# Patient Record
Sex: Male | Born: 1995 | Race: White | Hispanic: No | Marital: Single | Smoking: Never smoker
Health system: Southern US, Community
[De-identification: ages and names within clinical notes are randomized; demographics above are authoritative.]

## PROBLEM LIST (undated history)

## (undated) DIAGNOSIS — T7840XA Allergy, unspecified, initial encounter: Secondary | ICD-10-CM

## (undated) DIAGNOSIS — J45909 Unspecified asthma, uncomplicated: Secondary | ICD-10-CM

## (undated) HISTORY — PX: NO PAST SURGERIES: SHX2092

## (undated) HISTORY — DX: Unspecified asthma, uncomplicated: J45.909

## (undated) HISTORY — DX: Allergy, unspecified, initial encounter: T78.40XA

---
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
## 2016-01-19 DIAGNOSIS — J029 Acute pharyngitis, unspecified: Secondary | ICD-10-CM | POA: Diagnosis not present

## 2016-01-28 DIAGNOSIS — Z23 Encounter for immunization: Secondary | ICD-10-CM | POA: Diagnosis not present

## 2016-01-28 DIAGNOSIS — J019 Acute sinusitis, unspecified: Secondary | ICD-10-CM | POA: Diagnosis not present

## 2016-02-15 DIAGNOSIS — R0981 Nasal congestion: Secondary | ICD-10-CM | POA: Diagnosis not present

## 2016-02-15 DIAGNOSIS — J039 Acute tonsillitis, unspecified: Secondary | ICD-10-CM | POA: Diagnosis not present

## 2016-02-15 DIAGNOSIS — J342 Deviated nasal septum: Secondary | ICD-10-CM | POA: Diagnosis not present

## 2016-02-15 DIAGNOSIS — J351 Hypertrophy of tonsils: Secondary | ICD-10-CM | POA: Diagnosis not present

## 2016-05-03 DIAGNOSIS — J019 Acute sinusitis, unspecified: Secondary | ICD-10-CM | POA: Diagnosis not present

## 2016-05-03 DIAGNOSIS — J4520 Mild intermittent asthma, uncomplicated: Secondary | ICD-10-CM | POA: Diagnosis not present

## 2016-05-03 DIAGNOSIS — J302 Other seasonal allergic rhinitis: Secondary | ICD-10-CM | POA: Diagnosis not present

## 2016-05-25 DIAGNOSIS — J342 Deviated nasal septum: Secondary | ICD-10-CM | POA: Diagnosis not present

## 2016-06-01 ENCOUNTER — Other Ambulatory Visit: Payer: Self-pay | Admitting: Otolaryngology

## 2016-06-01 DIAGNOSIS — R1310 Dysphagia, unspecified: Secondary | ICD-10-CM

## 2016-06-05 ENCOUNTER — Ambulatory Visit: Payer: Self-pay

## 2016-06-05 ENCOUNTER — Ambulatory Visit
Admission: RE | Admit: 2016-06-05 | Discharge: 2016-06-05 | Disposition: A | Discharge status: Home / Self Care | Payer: 59 | Source: Ambulatory Visit | Attending: Otolaryngology | Admitting: Otolaryngology

## 2016-06-05 DIAGNOSIS — R1310 Dysphagia, unspecified: Secondary | ICD-10-CM | POA: Insufficient documentation

## 2016-06-23 ENCOUNTER — Ambulatory Visit
Admission: RE | Admit: 2016-06-23 | Discharge: 2016-06-23 | Disposition: A | Discharge status: Home / Self Care | Payer: 59 | Source: Ambulatory Visit | Attending: Pediatrics | Admitting: Pediatrics

## 2016-06-23 ENCOUNTER — Other Ambulatory Visit: Payer: Self-pay | Admitting: Pediatrics

## 2016-06-23 DIAGNOSIS — R079 Chest pain, unspecified: Secondary | ICD-10-CM | POA: Diagnosis not present

## 2016-07-14 DIAGNOSIS — J342 Deviated nasal septum: Secondary | ICD-10-CM | POA: Diagnosis not present

## 2016-07-14 DIAGNOSIS — R0981 Nasal congestion: Secondary | ICD-10-CM | POA: Diagnosis not present

## 2016-07-14 DIAGNOSIS — J343 Hypertrophy of nasal turbinates: Secondary | ICD-10-CM | POA: Diagnosis not present

## 2016-10-30 ENCOUNTER — Other Ambulatory Visit: Payer: Self-pay

## 2016-10-31 DIAGNOSIS — J302 Other seasonal allergic rhinitis: Secondary | ICD-10-CM | POA: Insufficient documentation

## 2016-10-31 DIAGNOSIS — J45909 Unspecified asthma, uncomplicated: Secondary | ICD-10-CM | POA: Insufficient documentation

## 2016-11-01 ENCOUNTER — Ambulatory Visit (INDEPENDENT_AMBULATORY_CARE_PROVIDER_SITE_OTHER): Payer: 59 | Admitting: Gastroenterology

## 2016-11-01 ENCOUNTER — Encounter: Payer: Self-pay | Admitting: Gastroenterology

## 2016-11-01 ENCOUNTER — Other Ambulatory Visit: Payer: Self-pay

## 2016-11-01 ENCOUNTER — Ambulatory Visit: Payer: Self-pay | Admitting: Gastroenterology

## 2016-11-01 VITALS — BP 135/89 | HR 102 | Temp 98.4°F | Ht 72.0 in | Wt 180.0 lb

## 2016-11-01 DIAGNOSIS — R1319 Other dysphagia: Secondary | ICD-10-CM

## 2016-11-01 DIAGNOSIS — R1310 Dysphagia, unspecified: Secondary | ICD-10-CM | POA: Diagnosis not present

## 2016-11-01 MED ORDER — DEXLANSOPRAZOLE 60 MG PO CPDR: 60.0000 mg | DELAYED_RELEASE_CAPSULE | Freq: Every day | ORAL | 0 refills | Status: DC

## 2016-11-01 NOTE — Progress Notes (Signed)
Gastroenterology Consultation  Referring Provider:     Bronson IngPage, Kristen, MD Primary Care Physician:  Bronson IngKristen Page, MD Primary Gastroenterologist:  Dr. Wyline MoodKiran Leiani Enright  Reason for Consultation:     Difficulty swallowing         HPI:   Perry Franklin is a 20 y.o. y/o male referred for consultation & management  by Dr. Bronson IngKristen Page, MD.     Dysphagia: Onset and any progression: couple of years Frequency: progressing Foods affected : solids, does not affect liquids Prior episodes of impaction: yes, eventually passed down  History of asthma/allergy : does suffer from asthma  History of heartburn/Reflux : no  Weight loss : no   Prior EGD: n0  PPI/H2 blocker use : yes previously but not presently .   He feels at times food "sits in his chest "     Past Medical History:  Diagnosis Date  . Allergy   . Asthma     Past Surgical History:  Procedure Laterality Date  . NO PAST SURGERIES     verified with patient 11/01/16    Prior to Admission medications   Medication Sig Start Date End Date Taking? Authorizing Provider  cetirizine (ZYRTEC) 10 MG tablet Take 10 mg by mouth daily.   Yes Historical Provider, MD    Family History  Problem Relation Age of Onset  . Gout Father   . Hyperlipidemia Father   . Asthma Brother   . Colon cancer Paternal Grandfather      Social History  Substance Use Topics  . Smoking status: Never Smoker  . Smokeless tobacco: Never Used  . Alcohol use Yes     Comment: 8 Beers Weekly    Allergies as of 11/01/2016 - Review Complete 11/01/2016  Allergen Reaction Noted  . Cefprozil Other (See Comments) 10/12/2014    Review of Systems:    All systems reviewed and negative except where noted in HPI.   Physical Exam:  BP 135/89   Pulse (!) 102   Temp 98.4 F (36.9 C) (Oral)   Ht 6' (1.829 m)   Wt 180 lb (81.6 kg)   BMI 24.41 kg/m  No LMP for male patient. Psych:  Alert and cooperative. Normal mood and affect. General:   Alert,   Well-developed, well-nourished, pleasant and cooperative in NAD Head:  Normocephalic and atraumatic. Eyes:  Sclera clear, no icterus.   Conjunctiva pink. Ears:  Normal auditory acuity. Nose:  No deformity, discharge, or lesions. Mouth:  No deformity or lesions,oropharynx pink & moist. Neck:  Supple; no masses or thyromegaly. Lungs:  Respirations even and unlabored.  Clear throughout to auscultation.   No wheezes, crackles, or rhonchi. No acute distress. Heart:  Regular rate and rhythm; no murmurs, clicks, rubs, or gallops. Abdomen:  Normal bowel sounds.  No bruits.  Soft, non-tender and non-distended without masses, hepatosplenomegaly or hernias noted.  No guarding or rebound tenderness.    Msk:  Symmetrical without gross deformities. Good, equal movement & strength bilaterally. Pulses:  Normal pulses noted. Extremities:  No clubbing or edema.  No cyanosis. Neurologic:  Alert and oriented x3;  grossly normal neurologically. Skin:  Intact without significant lesions or rashes. No jaundice. Lymph Nodes:  No significant cervical adenopathy. Psych:  Alert and cooperative. Normal mood and affect.  Imaging Studies: No results found.  Assessment and Plan:   Perry Franklin is a 20 y.o. y/o male has been referred for difficulty swallowing and food getting stuck in the past. Explained differential diagnosis  include eosinophilic esophagitis and secondary to GERD.   Plan: 1. 6 weeks of PPI therapy -Dexilant 60 mg once daily followed by EGD and biopsies of the esophagus.    I have discussed alternative options, risks & benefits,  which include, but are not limited to, bleeding, infection, perforation,respiratory complication & drug reaction.  The patient agrees with this plan & written consent will be obtained.     Follow up in 8 weeks  Dr Wyline MoodKiran Kentley Cedillo MD

## 2016-11-08 ENCOUNTER — Telehealth: Payer: Self-pay | Admitting: Gastroenterology

## 2016-11-08 NOTE — Telephone Encounter (Signed)
Pt's EGD rescheduled to Dec 28th. Endo has been notified.

## 2016-11-08 NOTE — Telephone Encounter (Signed)
Patients mother, eve, called and needed to reschedule his upper to Dec 28 or 29. She is off those days. Please call

## 2016-11-17 NOTE — Discharge Instructions (Signed)
[facility] °[facility] °ENDOSCOPIC SINUS SURGERY °[facility] ° °What is Functional Endoscopic Sinus Surgery? ° The Surgery involves making the natural openings of the sinuses larger by removing the bony partitions that separate the sinuses from the nasal cavity.  The natural sinus lining is preserved as much as possible to allow the sinuses to resume normal function after the surgery.  In some patients nasal polyps (excessively swollen lining of the sinuses) may be removed to relieve obstruction of the sinus openings.  The surgery is performed through the nose using lighted scopes, which eliminates the need for incisions on the face.  A septoplasty is a different procedure which is sometimes performed with sinus surgery.  It involves straightening the boy partition that separates the two sides of your nose.  A crooked or deviated septum may need repair if is obstructing the sinuses or nasal airflow.  Turbinate reduction is also often performed during sinus surgery.  The turbinates are bony proturberances from the side walls of the nose which swell and can obstruct the nose in patients with sinus and allergy problems.  Their size can be surgically reduced to help relieve nasal obstruction. ° °What Can Sinus Surgery Do For Me? ° Sinus surgery can reduce the frequency of sinus infections requiring antibiotic treatment.  This can provide improvement in nasal congestion, post-nasal drainage, facial pressure and nasal obstruction.  Surgery will NOT prevent you from ever having an infection again, so it usually only for patients who get infections 4 or more times yearly requiring antibiotics, or for infections that do not clear with antibiotics.  It will not cure nasal allergies, so patients with allergies may still require medication to treat their allergies after surgery. Surgery may improve headaches related to sinusitis, however, some people will continue to  require medication to control sinus headaches related to allergies.  Surgery will do nothing for other forms of headache (migraine, tension or cluster). ° °What Are the Risks of Endoscopic Sinus Surgery? ° Current techniques allow surgery to be performed safely with little risk, however, there are rare complications that patients should be aware of.  Because the sinuses are located around the eyes, there is risk of eye injury, including blindness, though again, this would be quite rare. This is usually a result of bleeding behind the eye during surgery, which puts the vision oat risk, though there are treatments to protect the vision and prevent permanent disrupted by surgery causing a leak of the spinal fluid that surrounds the brain.  More serious complications would include bleeding inside the brain cavity or damage to the brain.  Again, all of these complications are uncommon, and spinal fluid leaks can be safely managed surgically if they occur.  The most common complication of sinus surgery is bleeding from the nose, which may require packing or cauterization of the nose.  Continued sinus have polyps may experience recurrence of the polyps requiring revision surgery.  Alterations of sense of smell or injury to the tear ducts are also rare complications.  ° °What is the Surgery Like, and what is the Recovery? ° The Surgery usually takes a couple of hours to perform, and is usually performed under a general anesthetic (completely asleep).  Patients are usually discharged home after a couple of hours.  Sometimes during surgery it is necessary to pack the nose to control bleeding, and the packing is left in place for 24 - 48 hours, and removed by your surgeon.    If a septoplasty was performed during the procedure, there is often a splint placed which must be removed after 5-7 days.   °Discomfort: Pain is usually mild to moderate, and can be controlled by prescription pain medication or acetaminophen (Tylenol).   Aspirin, Ibuprofen (Advil, Motrin), or Naprosyn (Aleve) should be avoided, as they can cause increased bleeding.  Most patients feel sinus pressure like they have a bad head cold for several days.  Sleeping with your head elevated can help reduce swelling and facial pressure, as can ice packs over the face.  A humidifier may be helpful to keep the mucous and blood from drying in the nose.  ° °Diet: There are no specific diet restrictions, however, you should generally start with clear liquids and a light diet of bland foods because the anesthetic can cause some nausea.  Advance your diet depending on how your stomach feels.  Taking your pain medication with food will often help reduce stomach upset which pain medications can cause. ° °Nasal Saline Irrigation: It is important to remove blood clots and dried mucous from the nose as it is healing.  This is done by having you irrigate the nose at least 3 - 4 times daily with a salt water solution.  We recommend using NeilMed Sinus Rinse (available at the drug store).  Fill the squeeze bottle with the solution, bend over a sink, and insert the tip of the squeeze bottle into the nose ½ of an inch.  Point the tip of the squeeze bottle towards the inside corner of the eye on the same side your irrigating.  Squeeze the bottle and gently irrigate the nose.  If you bend forward as you do this, most of the fluid will flow back out of the nose, instead of down your throat.   The solution should be warm, near body temperature, when you irrigate.   Each time you irrigate, you should use a full squeeze bottle.  ° °Note that if you are instructed to use Nasal Steroid Sprays at any time after your surgery, irrigate with saline BEFORE using the steroid spray, so you do not wash it all out of the nose. °Another product, Nasal Saline Gel (such as AYR Nasal Saline Gel) can be applied in each nostril 3 - 4 times daily to moisture the nose and reduce scabbing or crusting. ° °Bleeding:   Bloody drainage from the nose can be expected for several days, and patients are instructed to irrigate their nose frequently with salt water to help remove mucous and blood clots.  The drainage may be dark red or brown, though some fresh blood may be seen intermittently, especially after irrigation.  Do not blow you nose, as bleeding may occur. If you must sneeze, keep your mouth open to allow air to escape through your mouth. ° °If heavy bleeding occurs: Irrigate the nose with saline to rinse out clots, then spray the nose 3 - 4 times with Afrin Nasal Decongestant Spray.  The spray will constrict the blood vessels to slow bleeding.  Pinch the lower half of your nose shut to apply pressure, and lay down with your head elevated.  Ice packs over the nose may help as well. If bleeding persists despite these measures, you should notify your doctor.  Do not use the Afrin routinely to control nasal congestion after surgery, as it can result in worsening congestion and may affect healing.  ° °Activity: Return to work varies among patients. Most patients will be out of   work at least 5 - 7 days to recover.  Patient may return to work after they are off of narcotic pain medication, and feeling well enough to perform the functions of their job.  Patients must avoid heavy lifting (over 10 pounds) or strenuous physical for 2 weeks after surgery, so your employer may need to assign you to light duty, or keep you out of work longer if light duty is not possible.  NOTE: you should not drive, operate dangerous machinery, do any mentally demanding tasks or make any important legal or financial decisions while on narcotic pain medication and recovering from the general anesthetic.  °  °Call Your Doctor Immediately if You Have Any of the Following: °1. Bleeding that you cannot control with the above measures °2. Loss of vision, double vision, bulging of the eye or black eyes. °3. Fever over 101 degrees °4. Neck stiffness with severe  headache, fever, nausea and change in mental state. °You are always encourage to call anytime with concerns, however, please call with requests for pain medication refills during office hours. ° °Office Endoscopy: During follow-up visits your doctor will remove any packing or splints that may have been placed and evaluate and clean your sinuses endoscopically.  Topical anesthetic will be used to make this as comfortable as possible, though you may want to take your pain medication prior to the visit.  How often this will need to be done varies from patient to patient.  After complete recovery from the surgery, you may need follow-up endoscopy from time to time, particularly if there is concern of recurrent infection or nasal polyps. ° ° °General Anesthesia, Adult, Care After °These instructions provide you with information about caring for yourself after your procedure. Your health care provider may also give you more specific instructions. Your treatment has been planned according to current medical practices, but problems sometimes occur. Call your health care provider if you have any problems or questions after your procedure. °What can I expect after the procedure? °After the procedure, it is common to have: °· Vomiting. °· A sore throat. °· Mental slowness. °It is common to feel: °· Nauseous. °· Cold or shivery. °· Sleepy. °· Tired. °· Sore or achy, even in parts of your body where you did not have surgery. °Follow these instructions at home: °For at least 24 hours after the procedure: °· Do not: °¨ Participate in activities where you could fall or become injured. °¨ Drive. °¨ Use heavy machinery. °¨ Drink alcohol. °¨ Take sleeping pills or medicines that cause drowsiness. °¨ Make important decisions or sign legal documents. °¨ Take care of children on your own. °· Rest. °Eating and drinking °· If you vomit, drink water, juice, or soup when you can drink without vomiting. °· Drink enough fluid to keep your  urine clear or pale yellow. °· Make sure you have little or no nausea before eating solid foods. °· Follow the diet recommended by your health care provider. °General instructions °· Have a responsible adult stay with you until you are awake and alert. °· Return to your normal activities as told by your health care provider. Ask your health care provider what activities are safe for you. °· Take over-the-counter and prescription medicines only as told by your health care provider. °· If you smoke, do not smoke without supervision. °· Keep all follow-up visits as told by your health care provider. This is important. °Contact a health care provider if: °· You continue to have nausea   or vomiting at home, and medicines are not helpful. °· You cannot drink fluids or start eating again. °· You cannot urinate after 8-12 hours. °· You develop a skin rash. °· You have fever. °· You have increasing redness at the site of your procedure. °Get help right away if: °· You have difficulty breathing. °· You have chest pain. °· You have unexpected bleeding. °· You feel that you are having a life-threatening or urgent problem. °This information is not intended to replace advice given to you by your health care provider. Make sure you discuss any questions you have with your health care provider. °Document Released: 03/05/2001 Document Revised: 05/01/2016 Document Reviewed: 11/11/2015 °Elsevier Interactive Patient Education © 2017 [employer] ° °

## 2016-11-20 ENCOUNTER — Encounter: Payer: Self-pay | Admitting: *Deleted

## 2016-11-23 ENCOUNTER — Ambulatory Visit: Payer: 59 | Admitting: Anesthesiology

## 2016-11-23 ENCOUNTER — Encounter
Admission: RE | Disposition: A | Discharge status: Home / Self Care | Payer: Self-pay | Source: Ambulatory Visit | Attending: Otolaryngology

## 2016-11-23 ENCOUNTER — Ambulatory Visit
Admission: RE | Admit: 2016-11-23 | Discharge: 2016-11-23 | Disposition: A | Discharge status: Home / Self Care | Payer: 59 | Source: Ambulatory Visit | Attending: Otolaryngology | Admitting: Otolaryngology

## 2016-11-23 DIAGNOSIS — J342 Deviated nasal septum: Secondary | ICD-10-CM | POA: Insufficient documentation

## 2016-11-23 DIAGNOSIS — J45909 Unspecified asthma, uncomplicated: Secondary | ICD-10-CM | POA: Diagnosis not present

## 2016-11-23 DIAGNOSIS — K219 Gastro-esophageal reflux disease without esophagitis: Secondary | ICD-10-CM | POA: Insufficient documentation

## 2016-11-23 DIAGNOSIS — J343 Hypertrophy of nasal turbinates: Secondary | ICD-10-CM | POA: Diagnosis not present

## 2016-11-23 DIAGNOSIS — J3489 Other specified disorders of nose and nasal sinuses: Secondary | ICD-10-CM | POA: Diagnosis not present

## 2016-11-23 HISTORY — PX: SEPTOPLASTY: SHX2393

## 2016-11-23 HISTORY — PX: TURBINATE REDUCTION: SHX6157

## 2016-11-23 SURGERY — SEPTOPLASTY, NOSE
Anesthesia: General | Site: Nose | Laterality: Right | Wound class: Clean Contaminated

## 2016-11-23 MED ORDER — LIDOCAINE-EPINEPHRINE 1 %-1:100000 IJ SOLN
INTRAMUSCULAR | Status: DC | PRN
  Administered 2016-11-23: 6 mL
  Administered 2016-11-23: 3 mL

## 2016-11-23 MED ORDER — ACETAMINOPHEN 10 MG/ML IV SOLN
1000.0000 mg | Freq: Once | INTRAVENOUS | Status: AC
  Administered 2016-11-23: 1000 mg via INTRAVENOUS

## 2016-11-23 MED ORDER — ONDANSETRON HCL 4 MG/2ML IJ SOLN
INTRAMUSCULAR | Status: DC | PRN
  Administered 2016-11-23: 4 mg via INTRAVENOUS

## 2016-11-23 MED ORDER — OXYCODONE HCL 5 MG PO TABS: 5.0000 mg | ORAL_TABLET | Freq: Once | ORAL | Status: AC | PRN

## 2016-11-23 MED ORDER — GLYCOPYRROLATE 0.2 MG/ML IJ SOLN
INTRAMUSCULAR | Status: DC | PRN
  Administered 2016-11-23: 0.1 mg via INTRAVENOUS

## 2016-11-23 MED ORDER — ONDANSETRON HCL 4 MG/2ML IJ SOLN: 4.0000 mg | Freq: Once | INTRAMUSCULAR | Status: DC | PRN

## 2016-11-23 MED ORDER — OXYCODONE HCL 5 MG/5ML PO SOLN
5.0000 mg | Freq: Once | ORAL | Status: AC | PRN
  Administered 2016-11-23: 5 mg via ORAL

## 2016-11-23 MED ORDER — ACETAMINOPHEN 325 MG PO TABS: 325.0000 mg | ORAL_TABLET | ORAL | Status: DC | PRN

## 2016-11-23 MED ORDER — PROPOFOL 10 MG/ML IV BOLUS
INTRAVENOUS | Status: DC | PRN
  Administered 2016-11-23: 200 mg via INTRAVENOUS
  Administered 2016-11-23: 50 mg via INTRAVENOUS

## 2016-11-23 MED ORDER — OXYMETAZOLINE HCL 0.05 % NA SOLN
2.0000 | Freq: Once | NASAL | Status: AC
  Administered 2016-11-23: 2 via NASAL

## 2016-11-23 MED ORDER — HYDROMORPHONE HCL 1 MG/ML IJ SOLN: 0.2500 mg | INTRAMUSCULAR | Status: DC | PRN

## 2016-11-23 MED ORDER — FENTANYL CITRATE (PF) 100 MCG/2ML IJ SOLN
INTRAMUSCULAR | Status: DC | PRN
  Administered 2016-11-23: 100 ug via INTRAVENOUS

## 2016-11-23 MED ORDER — SUCCINYLCHOLINE CHLORIDE 20 MG/ML IJ SOLN
INTRAMUSCULAR | Status: DC | PRN
  Administered 2016-11-23: 100 mg via INTRAVENOUS

## 2016-11-23 MED ORDER — ACETAMINOPHEN 160 MG/5ML PO SOLN: 325.0000 mg | ORAL | Status: DC | PRN

## 2016-11-23 MED ORDER — PHENYLEPHRINE HCL 0.5 % NA SOLN
NASAL | Status: DC | PRN
  Administered 2016-11-23: 30 mL via TOPICAL

## 2016-11-23 MED ORDER — CLINDAMYCIN PHOSPHATE 600 MG/50ML IV SOLN
600.0000 mg | Freq: Once | INTRAVENOUS | Status: AC
  Administered 2016-11-23: 600 mg via INTRAVENOUS

## 2016-11-23 MED ORDER — DEXAMETHASONE SODIUM PHOSPHATE 4 MG/ML IJ SOLN
INTRAMUSCULAR | Status: DC | PRN
  Administered 2016-11-23: 10 mg via INTRAVENOUS

## 2016-11-23 MED ORDER — MIDAZOLAM HCL 5 MG/5ML IJ SOLN
INTRAMUSCULAR | Status: DC | PRN
  Administered 2016-11-23: 2 mg via INTRAVENOUS

## 2016-11-23 MED ORDER — LACTATED RINGERS IV SOLN
INTRAVENOUS | Status: DC
  Administered 2016-11-23 (×2): via INTRAVENOUS

## 2016-11-23 MED ORDER — LIDOCAINE HCL (CARDIAC) 20 MG/ML IV SOLN
INTRAVENOUS | Status: DC | PRN
  Administered 2016-11-23: 50 mg via INTRAVENOUS

## 2016-11-23 SURGICAL SUPPLY — 22 items
CANISTER SUCT 1200ML W/VALVE (MISCELLANEOUS) ×3 IMPLANT
COAGULATOR SUCT 8FR VV (MISCELLANEOUS) ×3 IMPLANT
DRAPE HEAD BAR (DRAPES) ×3 IMPLANT
GLOVE PI ULTRA LF STRL 7.5 (GLOVE) ×3 IMPLANT
GLOVE PI ULTRA NON LATEX 7.5 (GLOVE) ×6
KIT ROOM TURNOVER OR (KITS) ×3 IMPLANT
NEEDLE ANESTHESIA  27G X 3.5 (NEEDLE)
NEEDLE ANESTHESIA 27G X 3.5 (NEEDLE) IMPLANT
NS IRRIG 500ML POUR BTL (IV SOLUTION) ×3 IMPLANT
PACK DRAPE NASAL/ENT (PACKS) ×3 IMPLANT
PACKING NASAL EPIS 4X2.4 XEROG (MISCELLANEOUS) ×3 IMPLANT
PAD GROUND ADULT SPLIT (MISCELLANEOUS) ×3 IMPLANT
PATTIES SURGICAL .5 X3 (DISPOSABLE) ×3 IMPLANT
SPLINT NASAL SEPTAL BLV .50 ST (MISCELLANEOUS) ×3 IMPLANT
STRAP BODY AND KNEE 60X3 (MISCELLANEOUS) ×3 IMPLANT
SUT CHROMIC 3-0 (SUTURE) ×2
SUT CHROMIC 3-0 KS 27XMFL CR (SUTURE) ×1
SUT ETHILON 3-0 KS 30 BLK (SUTURE) ×3 IMPLANT
SUT PLAIN GUT 4-0 (SUTURE) ×3 IMPLANT
SUTURE CHRMC 3-0 KS 27XMFL CR (SUTURE) ×1 IMPLANT
SYR 3ML LL SCALE MARK (SYRINGE) ×3 IMPLANT
TOWEL OR 17X26 4PK STRL BLUE (TOWEL DISPOSABLE) ×3 IMPLANT

## 2016-11-23 NOTE — Anesthesia Procedure Notes (Addendum)
Procedure Name: Intubation Date/Time: 11/23/2016 11:41 AM Performed by: Jimmy PicketAMYOT, Dela Sweeny Pre-anesthesia Checklist: Patient identified, Emergency Drugs available, Suction available, Patient being monitored and Timeout performed Patient Re-evaluated:Patient Re-evaluated prior to inductionOxygen Delivery Method: Circle system utilized Preoxygenation: Pre-oxygenation with 100% oxygen Intubation Type: IV induction, Rapid sequence and Cricoid Pressure applied Laryngoscope Size: Miller and 3 Grade View: Grade I Tube type: Oral Rae Tube size: 7.5 mm Number of attempts: 1 Airway Equipment and Method: Stylet Placement Confirmation: ETT inserted through vocal cords under direct vision,  positive ETCO2 and breath sounds checked- equal and bilateral Tube secured with: Tape Dental Injury: Teeth and Oropharynx as per pre-operative assessment

## 2016-11-23 NOTE — Anesthesia Preprocedure Evaluation (Signed)
Anesthesia Evaluation  Patient identified by MRN, date of birth, ID band Patient awake    Reviewed: Allergy & Precautions, H&P , NPO status , Patient's Chart, lab work & pertinent test results, reviewed documented beta blocker date and time   Airway Mallampati: I  TM Distance: >3 FB Neck ROM: full    Dental no notable dental hx.    Pulmonary asthma ,  Childhood asthma, but has used inhaler as recently as 2 months ago.   Pulmonary exam normal breath sounds clear to auscultation       Cardiovascular Exercise Tolerance: Good negative cardio ROS Normal cardiovascular exam Rhythm:regular Rate:Normal     Neuro/Psych negative neurological ROS  negative psych ROS   GI/Hepatic Neg liver ROS, GERD  ,Pt has issues with food and liquid getting "caught" in esophagus, and then brings it back up.  Is due for an endoscopy soon.  No symptoms this morning.   Endo/Other  negative endocrine ROS  Renal/GU negative Renal ROS  negative genitourinary   Musculoskeletal   Abdominal   Peds  Hematology negative hematology ROS (+)   Anesthesia Other Findings   Reproductive/Obstetrics negative OB ROS                             Anesthesia Physical Anesthesia Plan  ASA: II  Anesthesia Plan: General   Post-op Pain Management:    Induction:   Airway Management Planned:   Additional Equipment:   Intra-op Plan:   Post-operative Plan:   Informed Consent: I have reviewed the patients History and Physical, chart, labs and discussed the procedure including the risks, benefits and alternatives for the proposed anesthesia with the patient or authorized representative who has indicated his/her understanding and acceptance.   Dental Advisory Given  Plan Discussed with: CRNA and Anesthesiologist  Anesthesia Plan Comments:         Anesthesia Quick Evaluation

## 2016-11-23 NOTE — Op Note (Signed)
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 The right middle turbinate had a huge conchal bullosa was several different air bubbles inside it. The right inferior turbinate had very hypertrophied soft tissue filling most of the airway created from the deviated septum to the opposite side  Procedure: With the patient in a comfortable supine position,  general orotracheal anesthesia was induced without difficulty.     The patient received preoperative Afrin spray for topical decongestion and vasoconstriction.  Intravenous prophylactic antibiotics were administered.  At an appropriate level, the patient was placed in a semi-sitting position.  Nasal vibrissae were trimmed.   1% Xylocaine with 1:100,000 epinephrine, 6 cc's, was infiltrated into the anterior floor of the nose, into the nasal spine region, into the membranous columella, and finally into the submucoperichondrial plane of the septum on both sides.  Several minutes were allowed for this to take effect.  Cottoniod pledgetts soaked in Afrin and 4% Xylocaine were placed into both nasal cavities and left while the patient was prepped and draped in the standard fashion.  The materials were removed from the nose and observed to be intact and correct in number.  The nose was inspected with a headlight and a 0 endoscope with the findings as described above.  A left Killian incision was sharply executed and carried down to the quadrangular cartilage. The  mucoperichondrium was elelvated along the quadrangular plate back to the bony-cartilaginous junction. The mucoperiostium was then elevated along the ethmoid plate and the vomer. The boney-catilaginous junction was then split with a freer elevator and the mucoperiosteum was elevated on the opposite side. The mucoperiosteum was then elevated along the maxillary crest as needed to expose the crooked bone of the crest.  Boney spurs of the vomer and maxillary crest were removed with Lenoria Chimeakahashi forceps. This was pushing way off into the left nasal passageway that had to be removed  The cartilaginous plate was trimmed along its posterior and inferior borders of about 2 mm of cartilage to free it up inferiorly. Some of the deviated ethmoid plate was then fractured and removed with Takahashi forceps to free up the posterior border of the quadrangular plate and allow it to swing back to the midline. The mucosal flaps were placed back into their anatomic position to allow visualization of the airways. The septum now sat in the midline with an improved airway.  A 3-0 Chromic suture on a Keith needle in used to anchor the inferior septum at the nasal spine with a through and through suture. The mucosal flaps are then sutured together using a through and through whip stitch of 4-0 Plain Gut with a mini-Keith needle. This was used to close the CamdenKillian incision as well.   The 0 scope was used to visualize the right middle turbinate. It was massive. An incision was made along its anterior border and a very large conchal bullosa was opened up. Through biting forceps were used for removal of the entire lateral wall of the middle turbinate and some of  its inferior border. There were several air bubbles of conchal bullosa within the sinus that were opened up and trimmed. This left the medial wall and some its inferior curve neck could be lateralized some to create a new middle turbinate. Left cautery was used along its trimmed  edge to help control bleeding. This took a long time because it was so misshapened and trying to correct shape to make a normal turbinate.  The inferior turbinates were then inspected. An incision was created along the inferior aspect of the right inferior turbinate with removal of some of the inferior soft tissue and bone. Electrocautery was used to control bleeding in the area. The remaining turbinate was then outfractured to open up the airway further. There was no significant bleeding noted. The right turbinate was then visualized. There is some swelling of the soft tissue. Electrocautery was used to help control some of this. Xerogel was then placed along the middle turbinate on the right side  The airways were then visualized and showed open passageways on both sides that were significantly improved compared to before surgery. There was no signifcant bleeding. Nasal splints were applied to both sides of the septum using Xomed 0.395mm regular sized splints that were trimmed, and then held in position with a 3-0 Nylon through and through suture.  The patient was turned back over to anesthesia, and awakened, extubated, and taken to the PACU in satisfactory condition.  Dispo:   PACU to home  Plan: Ice, elevation, narcotic analgesia, steroid taper, and prophylactic antibiotics for the duration of indwelling nasal foreign bodies.  We will reevaluate the patient in the office in 6 days and remove the septal splints.  Return to work in 10 days, strenuous activities in two weeks.   Ane Conerly H 11/23/2016 1:15 PM

## 2016-11-23 NOTE — Anesthesia Postprocedure Evaluation (Signed)
Anesthesia Post Note  Patient: Perry Franklin  Procedure(s) Performed: Procedure(s) (LRB): SEPTOPLASTY (Right) RIGHT ENDOSCOPIC MIDDLE TURBINATE AND INFERIOR REDUCTION (Right)  Patient location during evaluation: PACU Anesthesia Type: General Level of consciousness: awake and alert Pain management: pain level controlled Vital Signs Assessment: post-procedure vital signs reviewed and stable Respiratory status: spontaneous breathing, nonlabored ventilation, respiratory function stable and patient connected to nasal cannula oxygen Cardiovascular status: blood pressure returned to baseline and stable Postop Assessment: no signs of nausea or vomiting Anesthetic complications: no    [name], [name]

## 2016-11-23 NOTE — H&P (Signed)
  H&P has been reviewed and no changes necessary. To be downloaded later. 

## 2016-11-23 NOTE — Transfer of Care (Signed)
Immediate Anesthesia Transfer of Care Note  Patient: Perry Franklin  Procedure(s) Performed: Procedure(s): SEPTOPLASTY (Right) RIGHT ENDOSCOPIC MIDDLE TURBINATE AND INFERIOR REDUCTION (Right)  Patient Location: PACU  Anesthesia Type: General  Level of Consciousness: awake, alert  and patient cooperative  Airway and Oxygen Therapy: Patient Spontanous Breathing and Patient connected to supplemental oxygen  Post-op Assessment: Post-op Vital signs reviewed, Patient's Cardiovascular Status Stable, Respiratory Function Stable, Patent Airway and No signs of Nausea or vomiting  Post-op Vital Signs: Reviewed and stable  Complications: No apparent anesthesia complications

## 2016-11-24 ENCOUNTER — Encounter: Payer: Self-pay | Admitting: Otolaryngology

## 2016-11-29 DIAGNOSIS — Z48813 Encounter for surgical aftercare following surgery on the respiratory system: Secondary | ICD-10-CM | POA: Diagnosis not present

## 2016-12-06 ENCOUNTER — Telehealth: Payer: Self-pay | Admitting: Gastroenterology

## 2016-12-06 ENCOUNTER — Encounter: Payer: Self-pay | Admitting: *Deleted

## 2016-12-06 DIAGNOSIS — Z48813 Encounter for surgical aftercare following surgery on the respiratory system: Secondary | ICD-10-CM | POA: Diagnosis not present

## 2016-12-06 NOTE — Telephone Encounter (Signed)
Cancel procedure for tomorrow. His grandmother passed away. Please call [phone] to reschedule. They stated it is okay to leave a voice message since they have a lot going on.

## 2016-12-07 ENCOUNTER — Encounter: Admission: RE | Payer: Self-pay | Source: Ambulatory Visit

## 2016-12-07 ENCOUNTER — Ambulatory Visit: Admission: RE | Admit: 2016-12-07 | Payer: 59 | Source: Ambulatory Visit | Admitting: Gastroenterology

## 2016-12-07 SURGERY — ESOPHAGOGASTRODUODENOSCOPY (EGD) WITH PROPOFOL
Anesthesia: General

## 2016-12-13 DIAGNOSIS — Z48813 Encounter for surgical aftercare following surgery on the respiratory system: Secondary | ICD-10-CM | POA: Diagnosis not present

## 2016-12-15 NOTE — Telephone Encounter (Signed)
Left vm for pt to return my call to reschedule EGD.

## 2016-12-29 DIAGNOSIS — Z48813 Encounter for surgical aftercare following surgery on the respiratory system: Secondary | ICD-10-CM | POA: Diagnosis not present

## 2017-01-12 DIAGNOSIS — Z48813 Encounter for surgical aftercare following surgery on the respiratory system: Secondary | ICD-10-CM | POA: Diagnosis not present

## 2017-01-26 DIAGNOSIS — J301 Allergic rhinitis due to pollen: Secondary | ICD-10-CM | POA: Diagnosis not present

## 2017-03-09 DIAGNOSIS — L700 Acne vulgaris: Secondary | ICD-10-CM | POA: Diagnosis not present

## 2017-03-09 DIAGNOSIS — L910 Hypertrophic scar: Secondary | ICD-10-CM | POA: Diagnosis not present

## 2017-03-09 DIAGNOSIS — L2089 Other atopic dermatitis: Secondary | ICD-10-CM | POA: Diagnosis not present

## 2017-03-09 DIAGNOSIS — L858 Other specified epidermal thickening: Secondary | ICD-10-CM | POA: Diagnosis not present

## 2017-05-03 IMAGING — RF DG ESOPHAGUS
9 series · 14 of 14 positions shown · non-contrast
Comparison: No recent prior.

CLINICAL DATA: Dysphagia.

EXAM:
ESOPHOGRAM / BARIUM SWALLOW / BARIUM TABLET STUDY
TECHNIQUE: Combined double contrast and single contrast examination performed
using effervescent crystals, thick barium liquid, and thin barium
liquid. The patient was observed with fluoroscopy swallowing a 13 mm
barium sulphate tablet.
FLUOROSCOPY TIME:  Radiation Exposure Index (as provided by the
fluoroscopic device): 14.2 mGy

[Series 1: fluoro_barium 2fps_bw · 0.17mm/px · 4 of 5 frames shown (1 of 9)]
[frame 1/5]
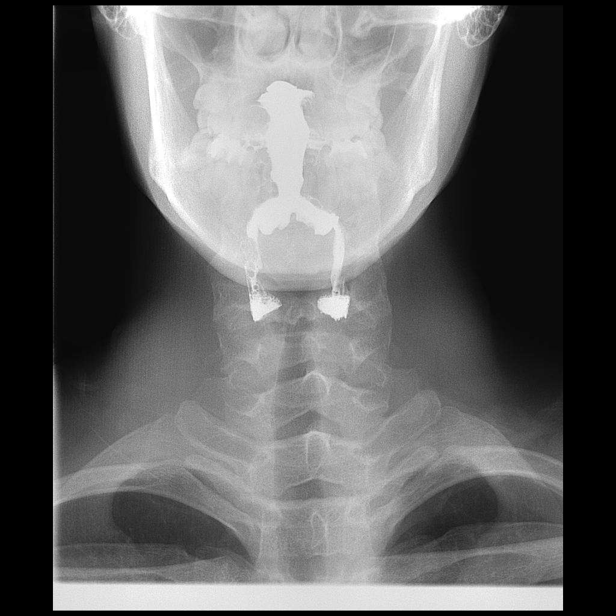
[frame 2/5]
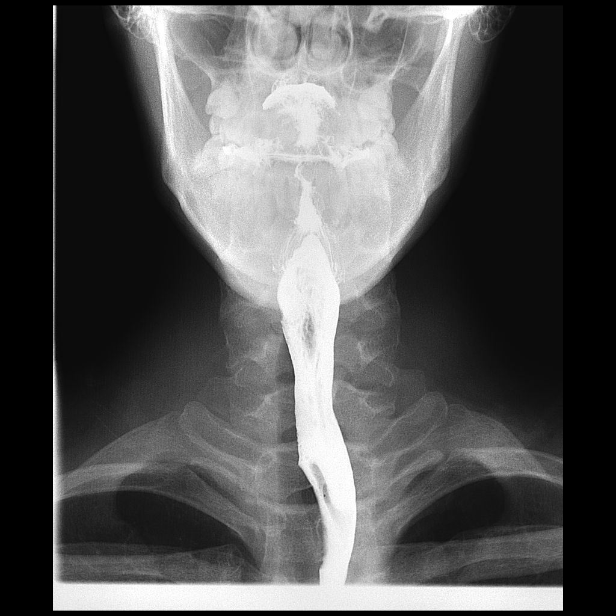
[frame 3/5]
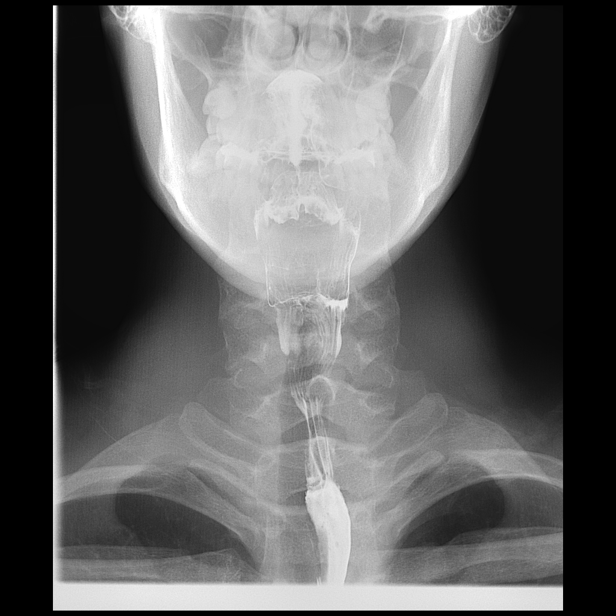
[frame 5/5]
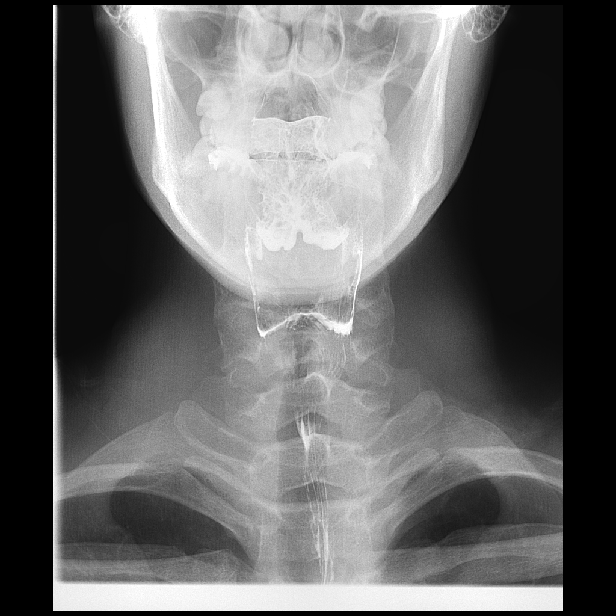

[Series 2: fluoro_barium 2fps_bw · 0.17mm/px · 3 of 6 frames shown (2 of 9)]
[frame 1/6]
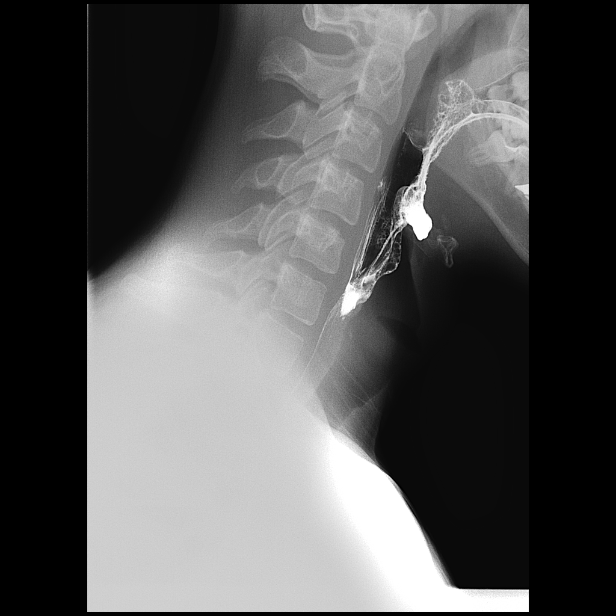
[frame 4/6]
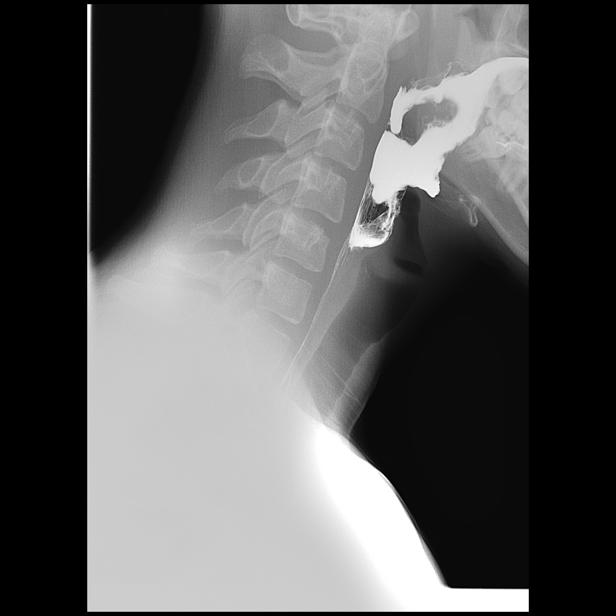
[frame 6/6]
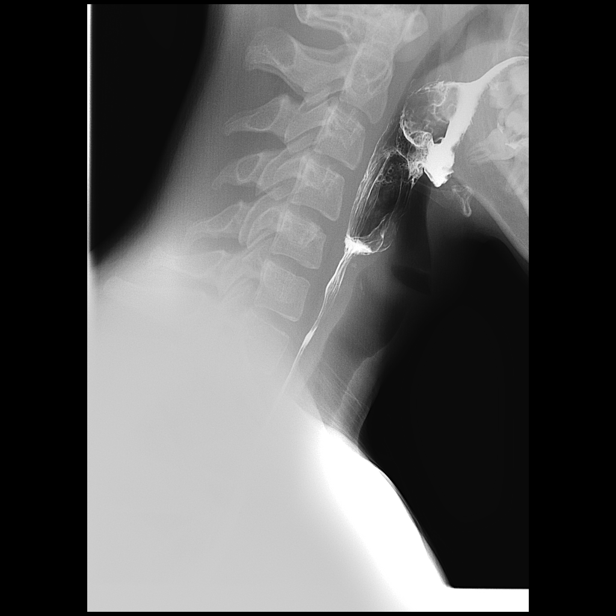

[Series 3: fluoro_barium 2fps_bw · 0.17mm/px · 1 of 1 slices shown (3 of 9)]
[im 1/1]
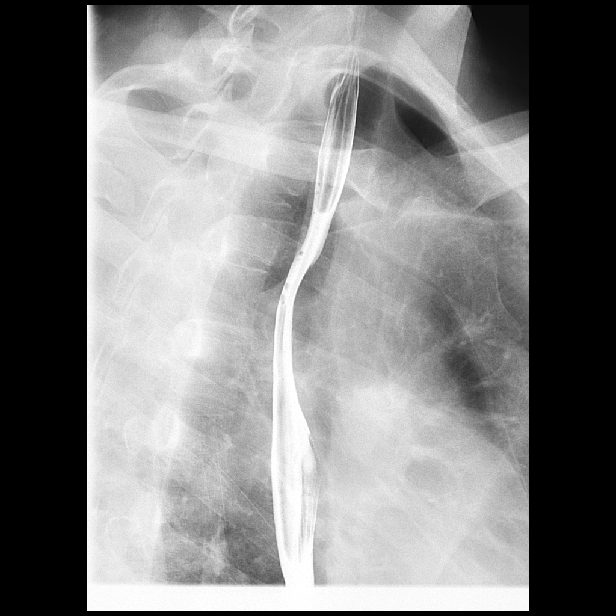

[Series 4: fluoro_barium 2fps_bw · 0.17mm/px · 1 of 1 slices shown (4 of 9)]
[im 1/1]
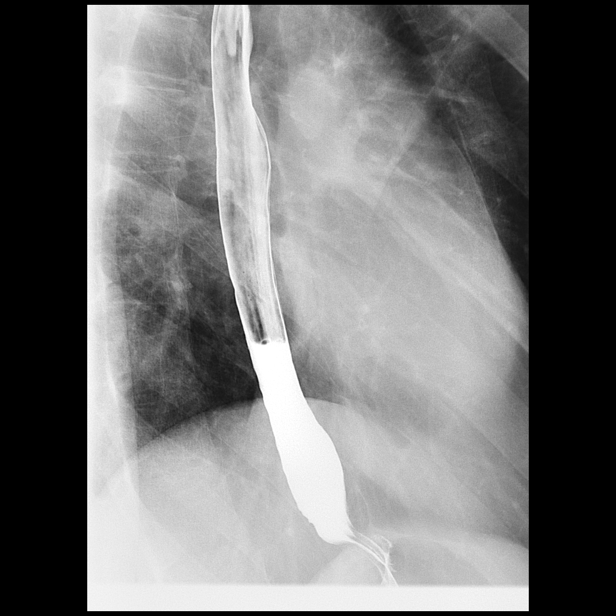

[Series 5: fluoro_barium 2fps_bw · 0.18mm/px · 1 of 1 slices shown (5 of 9)]
[im 1/1]
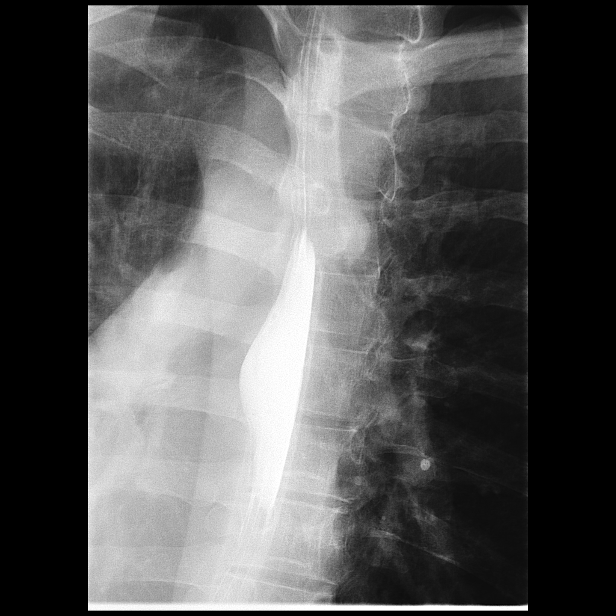

[Series 6: fluoro_barium 2fps_bw · 0.18mm/px · 1 of 1 slices shown (6 of 9)]
[im 1/1]
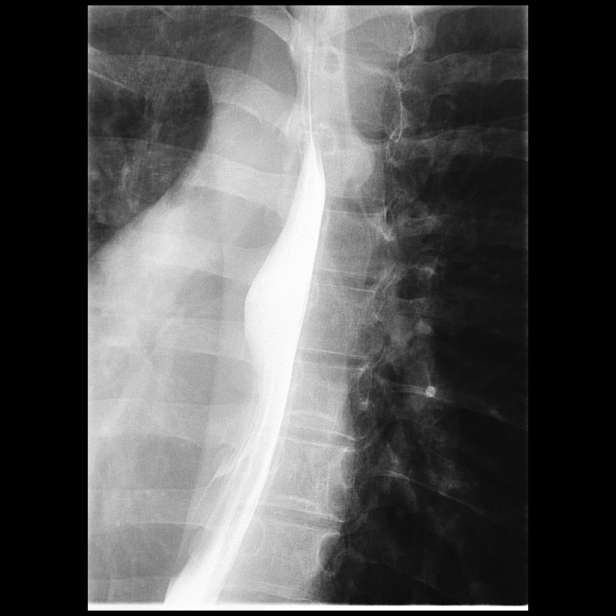

[Series 7: fluoro_barium 2fps_bw · 0.18mm/px · 1 of 1 slices shown (7 of 9)]
[im 1/1]
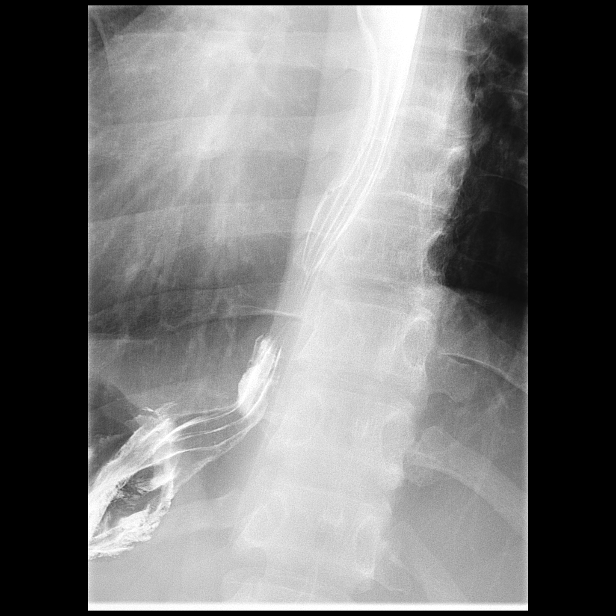

[Series 8: fluoro_barium 2fps_bw · 0.18mm/px · 1 of 1 slices shown (8 of 9)]
[im 1/1]
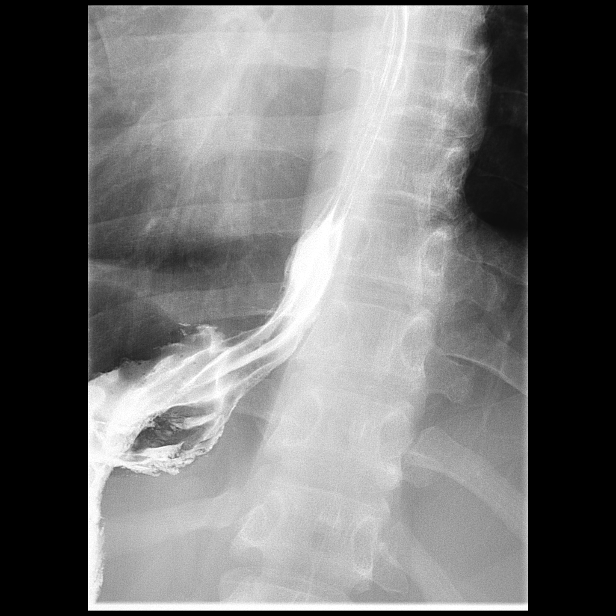

[Series 9: fluoro_barium 2fps_bw · 0.18mm/px · 1 of 1 slices shown (9 of 9)]
[im 1/1]
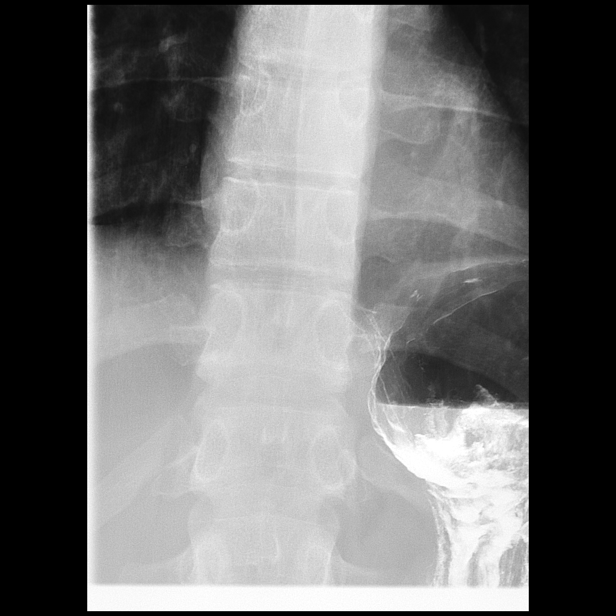

[14 of 14 positions shown; findings below may reference images not displayed]

FINDINGS: Cervical esophagus is widely patent. Thoracic esophagus is widely
patent. No focal abnormality identified. No evidence of reflux.
Standardized barium tablet passed normally.
IMPRESSION: Normal exam.

## 2017-05-21 ENCOUNTER — Telehealth: Payer: Self-pay | Admitting: Gastroenterology

## 2017-05-21 NOTE — Telephone Encounter (Signed)
Patient is ready to reschedule his EGD. He is still having problems

## 2017-05-22 ENCOUNTER — Other Ambulatory Visit: Payer: Self-pay

## 2017-05-22 DIAGNOSIS — R1319 Other dysphagia: Secondary | ICD-10-CM

## 2017-05-22 DIAGNOSIS — R1310 Dysphagia, unspecified: Secondary | ICD-10-CM

## 2017-05-24 ENCOUNTER — Telehealth: Payer: Self-pay | Admitting: Gastroenterology

## 2017-05-24 NOTE — Telephone Encounter (Signed)
Patient left a voice message that he needs to reschedule his procedure

## 2017-06-25 ENCOUNTER — Telehealth: Payer: Self-pay | Admitting: Gastroenterology

## 2017-06-25 NOTE — Telephone Encounter (Signed)
Patients mother, Clarene Critchleyve, called and wanted to know any instructions for Earl LitesGregory for his procedure. Please call  [phone]

## 2017-06-28 ENCOUNTER — Encounter: Payer: Self-pay | Admitting: *Deleted

## 2017-06-29 ENCOUNTER — Ambulatory Visit: Payer: 59 | Admitting: Anesthesiology

## 2017-06-29 ENCOUNTER — Encounter: Payer: Self-pay | Admitting: *Deleted

## 2017-06-29 ENCOUNTER — Ambulatory Visit
Admission: RE | Admit: 2017-06-29 | Discharge: 2017-06-29 | Disposition: A | Discharge status: Home / Self Care | Payer: 59 | Source: Ambulatory Visit | Attending: Gastroenterology | Admitting: Gastroenterology

## 2017-06-29 ENCOUNTER — Encounter
Admission: RE | Disposition: A | Discharge status: Home / Self Care | Payer: Self-pay | Source: Ambulatory Visit | Attending: Gastroenterology

## 2017-06-29 DIAGNOSIS — Z79899 Other long term (current) drug therapy: Secondary | ICD-10-CM | POA: Insufficient documentation

## 2017-06-29 DIAGNOSIS — Z888 Allergy status to other drugs, medicaments and biological substances status: Secondary | ICD-10-CM | POA: Insufficient documentation

## 2017-06-29 DIAGNOSIS — E785 Hyperlipidemia, unspecified: Secondary | ICD-10-CM | POA: Diagnosis not present

## 2017-06-29 DIAGNOSIS — K2970 Gastritis, unspecified, without bleeding: Secondary | ICD-10-CM | POA: Diagnosis not present

## 2017-06-29 DIAGNOSIS — Z800 Family history of malignant neoplasm of digestive organs: Secondary | ICD-10-CM | POA: Insufficient documentation

## 2017-06-29 DIAGNOSIS — K3189 Other diseases of stomach and duodenum: Secondary | ICD-10-CM | POA: Insufficient documentation

## 2017-06-29 DIAGNOSIS — J45909 Unspecified asthma, uncomplicated: Secondary | ICD-10-CM | POA: Diagnosis not present

## 2017-06-29 DIAGNOSIS — K200 Eosinophilic esophagitis: Secondary | ICD-10-CM | POA: Diagnosis not present

## 2017-06-29 DIAGNOSIS — K219 Gastro-esophageal reflux disease without esophagitis: Secondary | ICD-10-CM | POA: Diagnosis not present

## 2017-06-29 DIAGNOSIS — R1310 Dysphagia, unspecified: Secondary | ICD-10-CM | POA: Insufficient documentation

## 2017-06-29 DIAGNOSIS — M109 Gout, unspecified: Secondary | ICD-10-CM | POA: Diagnosis not present

## 2017-06-29 DIAGNOSIS — R1319 Other dysphagia: Secondary | ICD-10-CM

## 2017-06-29 DIAGNOSIS — K2950 Unspecified chronic gastritis without bleeding: Secondary | ICD-10-CM | POA: Diagnosis not present

## 2017-06-29 HISTORY — PX: ESOPHAGOGASTRODUODENOSCOPY (EGD) WITH PROPOFOL: SHX5813

## 2017-06-29 SURGERY — ESOPHAGOGASTRODUODENOSCOPY (EGD) WITH PROPOFOL
Anesthesia: General

## 2017-06-29 MED ORDER — MIDAZOLAM HCL 2 MG/2ML IJ SOLN
INTRAMUSCULAR | Status: DC | PRN
  Administered 2017-06-29: 2 mg via INTRAVENOUS

## 2017-06-29 MED ORDER — MIDAZOLAM HCL 2 MG/2ML IJ SOLN
INTRAMUSCULAR | Status: AC
  Filled 2017-06-29: qty 2

## 2017-06-29 MED ORDER — LIDOCAINE HCL (CARDIAC) 20 MG/ML IV SOLN: INTRAVENOUS | Status: DC | PRN

## 2017-06-29 MED ORDER — SODIUM CHLORIDE 0.9 % IV SOLN
INTRAVENOUS | Status: DC
  Administered 2017-06-29: 10:00:00 via INTRAVENOUS

## 2017-06-29 MED ORDER — FENTANYL CITRATE (PF) 100 MCG/2ML IJ SOLN
INTRAMUSCULAR | Status: AC
  Filled 2017-06-29: qty 2

## 2017-06-29 MED ORDER — PROPOFOL 500 MG/50ML IV EMUL
INTRAVENOUS | Status: DC | PRN
  Administered 2017-06-29: 180 ug/kg/min via INTRAVENOUS

## 2017-06-29 MED ORDER — LIDOCAINE HCL (CARDIAC) 20 MG/ML IV SOLN
INTRAVENOUS | Status: DC | PRN
  Administered 2017-06-29: 3 mL via INTRATRACHEAL

## 2017-06-29 MED ORDER — PROPOFOL 10 MG/ML IV BOLUS
INTRAVENOUS | Status: DC | PRN
  Administered 2017-06-29: 50 mg via INTRAVENOUS
  Administered 2017-06-29: 20 mg via INTRAVENOUS

## 2017-06-29 MED ORDER — LIDOCAINE HCL (PF) 2 % IJ SOLN
INTRAMUSCULAR | Status: AC
  Filled 2017-06-29: qty 2

## 2017-06-29 MED ORDER — FENTANYL CITRATE (PF) 100 MCG/2ML IJ SOLN
INTRAMUSCULAR | Status: DC | PRN
  Administered 2017-06-29: 50 ug via INTRAVENOUS

## 2017-06-29 NOTE — Transfer of Care (Signed)
Immediate Anesthesia Transfer of Care Note  Patient: Perry NicolasGregory J Wiler  Procedure(s) Performed: Procedure(s): ESOPHAGOGASTRODUODENOSCOPY (EGD) WITH PROPOFOL (N/A)  Patient Location: PACU  Anesthesia Type:General  Level of Consciousness: awake  Airway & Oxygen Therapy: Patient Spontanous Breathing  Post-op Assessment: Report given to RN and Post -op Vital signs reviewed and stable  Post vital signs: Reviewed and stable  Last Vitals:  Vitals:   06/29/17 0819 06/29/17 1002  BP: 107/70 109/66  Pulse: 69 67  Resp: 16   Temp: (!) 36 C (!) 36.1 C    Last Pain:  Vitals:   06/29/17 1002  TempSrc: Tympanic         Complications: No apparent anesthesia complications

## 2017-06-29 NOTE — Anesthesia Post-op Follow-up Note (Cosign Needed)
Anesthesia QCDR form completed.        

## 2017-06-29 NOTE — Anesthesia Procedure Notes (Signed)
Date/Time: 06/29/2017 9:30 AM Performed by: Henrietta HooverPOPE, Yamileth Hayse Pre-anesthesia Checklist: Patient identified, Emergency Drugs available, Suction available, Timeout performed and Patient being monitored Patient Re-evaluated:Patient Re-evaluated prior to induction Oxygen Delivery Method: Nasal cannula Placement Confirmation: positive ETCO2

## 2017-06-29 NOTE — Anesthesia Preprocedure Evaluation (Signed)
Anesthesia Evaluation  Patient identified by MRN, date of birth, ID band Patient awake    Reviewed: Allergy & Precautions, H&P , NPO status , Patient's Chart, lab work & pertinent test results  History of Anesthesia Complications Negative for: history of anesthetic complications  Airway Mallampati: I  TM Distance: >3 FB Neck ROM: full    Dental  (+) Teeth Intact   Pulmonary neg shortness of breath, asthma ,           Cardiovascular Exercise Tolerance: Good (-) angina(-) Past MI and (-) DOE negative cardio ROS       Neuro/Psych negative neurological ROS  negative psych ROS   GI/Hepatic Neg liver ROS, GERD  Controlled,  Endo/Other  negative endocrine ROS  Renal/GU negative Renal ROS  negative genitourinary   Musculoskeletal   Abdominal   Peds  Hematology negative hematology ROS (+)   Anesthesia Other Findings Past Medical History: No date: Allergy No date: Asthma  Past Surgical History: No date: NO PAST SURGERIES     Comment:  verified with patient 11/01/16 11/23/2016: SEPTOPLASTY; Right     Comment:  Procedure: SEPTOPLASTY;  Surgeon: Vernie MurdersPaul Juengel, MD;                [facility];  Service: ENT;                Laterality: Right; 11/23/2016: TURBINATE REDUCTION; Right     Comment:  Procedure: RIGHT ENDOSCOPIC MIDDLE TURBINATE AND               INFERIOR REDUCTION;  Surgeon: Vernie MurdersPaul Juengel, MD;                [facility];  Service: ENT;                Laterality: Right;  BMI    Body Mass Index:  22.52 kg/m      Reproductive/Obstetrics negative OB ROS                             Anesthesia Physical Anesthesia Plan  ASA: III  Anesthesia Plan: General   Post-op Pain Management:    Induction: Intravenous  PONV Risk Score and Plan:   Airway Management Planned: Natural Airway and Nasal Cannula  Additional Equipment:   Intra-op Plan:    Post-operative Plan:   Informed Consent: I have reviewed the patients History and Physical, chart, labs and discussed the procedure including the risks, benefits and alternatives for the proposed anesthesia with the patient or authorized representative who has indicated his/her understanding and acceptance.   Dental Advisory Given  Plan Discussed with: Anesthesiologist, CRNA and Surgeon  Anesthesia Plan Comments: (Patient consented for risks of anesthesia including but not limited to:  - adverse reactions to medications - risk of intubation if required - damage to teeth, lips or other oral mucosa - sore throat or hoarseness - Damage to heart, brain, lungs or loss of life  Patient voiced understanding.)        Anesthesia Quick Evaluation

## 2017-06-29 NOTE — Op Note (Signed)
[facility] Gastroenterology Patient Name: Perry Franklin Procedure Date: 06/29/2017 9:36 AM MRN: [id number] Account #: [id number] Date of Birth: August 05, 1996 Admit Type: Outpatient Age: 21 Room: [facility] ENDO ROOM 4 Gender: Male Note Status: Finalized Procedure:            Upper GI endoscopy Indications:          Dysphagia Providers:            Wyline Mood MD, MD Referring MD:         [name], MD (Referring MD) Medicines:            Monitored Anesthesia Care Complications:        No immediate complications. Procedure:            Pre-Anesthesia Assessment:                       - Prior to the procedure, a History and Physical was                        performed, and patient medications, allergies and                        sensitivities were reviewed. The patient's tolerance of                        previous anesthesia was reviewed.                       - The risks and benefits of the procedure and the                        sedation options and risks were discussed with the                        patient. All questions were answered and informed                        consent was obtained.                       - ASA Grade Assessment: III - A patient with severe                        systemic disease.                       After obtaining informed consent, the endoscope was                        passed under direct vision. Throughout the procedure,                        the patient's blood pressure, pulse, and oxygen                        saturations were monitored continuously. The Endoscope                        was introduced through the mouth, and advanced to the  third part of duodenum. The upper GI endoscopy was                        accomplished with ease. The patient tolerated the                        procedure well. Findings:      Diffuse mucosal flattening was found in the entire examined duodenum.       Biopsies for  histology were taken with a cold forceps for evaluation of       celiac disease.      Diffuse moderately erythematous mucosa without bleeding was found in the       prepyloric region of the stomach. Biopsies were taken with a cold       forceps for histology.      Mucosal changes including longitudinal furrows, longitudinal markings       and white specks were found in the entire esophagus. Esophageal findings       were graded using the Eosinophilic Esophagitis Endoscopic Reference       Score (EoE-EREFS) as: Rings Grade 0 None (no ridges or rings seen),       Exudates Grade 0 None (no white lesions seen), Furrows Grade 1 Present       (vertical lines with or without visible depth) and Stricture none (no       stricture found). Biopsies were obtained from the proximal and distal       esophagus with cold forceps for histology of suspected eosinophilic       esophagitis.      The exam was otherwise without abnormality. Impression:           - Flattened mucosa was found in the duodenum,                        suspicious for celiac disease. Biopsied.                       - Erythematous mucosa in the prepyloric region of the                        stomach. Biopsied.                       - Esophageal mucosal changes suggestive of eosinophilic                        esophagitis. Biopsied.                       - The examination was otherwise normal. Recommendation:       - Discharge patient to home (with escort).                       - Advance diet as tolerated.                       - Continue present medications.                       - Await pathology results.                       - Return to my office in 3  weeks. Procedure Code(s):    --- Professional ---                       [phone], Esophagogastroduodenoscopy, flexible, transoral;                        with biopsy, single or multiple Diagnosis Code(s):    --- Professional ---                       K31.89, Other diseases of stomach  and duodenum                       K20.9, Esophagitis, unspecified                       R13.10, Dysphagia, unspecified CPT copyright 2016 American Medical Association. All rights reserved. The codes documented in this report are preliminary and upon coder review may  be revised to meet current compliance requirements. Wyline MoodKiran Kathyjo Briere, MD Wyline MoodKiran Liana Camerer MD, MD 06/29/2017 9:54:52 AM This report has been signed electronically. Number of Addenda: 0 Note Initiated On: 06/29/2017 9:36 AM      [facility]

## 2017-06-29 NOTE — H&P (Signed)
  Wyline MoodKiran Marsa Matteo MD [address] [place], [place] [address] Phone: [phone] Fax : [phone]  Primary Care Physician:  Patient, No Pcp Per Primary Gastroenterologist:  Dr. Wyline MoodKiran Memori Sammon   Pre-Procedure History & Physical: HPI:  Perry Franklin Perry Franklin is a 21 y.o. male is here for an endoscopy.   Past Medical History:  Diagnosis Date  . Allergy   . Asthma     Past Surgical History:  Procedure Laterality Date  . NO PAST SURGERIES     verified with patient 11/01/16  . SEPTOPLASTY Right 11/23/2016   Procedure: SEPTOPLASTY;  Surgeon: Vernie MurdersPaul Juengel, MD;  [facility];  Service: ENT;  Laterality: Right;  . TURBINATE REDUCTION Right 11/23/2016   Procedure: RIGHT ENDOSCOPIC MIDDLE TURBINATE AND INFERIOR REDUCTION;  Surgeon: Vernie MurdersPaul Juengel, MD;  [facility];  Service: ENT;  Laterality: Right;    Prior to Admission medications   Medication Sig Start Date End Date Taking? Authorizing Provider  acetaminophen (TYLENOL) 325 MG tablet Take 650 mg by mouth every 6 (six) hours as needed.    [provider]  albuterol (PROVENTIL HFA;VENTOLIN HFA) 108 (90 Base) MCG/ACT inhaler Inhale into the lungs every 6 (six) hours as needed for wheezing or shortness of breath.    [provider]  cyclobenzaprine (FLEXERIL) 10 MG tablet Take by mouth. 10/09/14   [provider]  doxycycline (VIBRA-TABS) 100 MG tablet Take by mouth. 10/09/14   [provider]    Allergies as of 05/22/2017 - Review Complete 12/06/2016  Allergen Reaction Noted  . Cefprozil Other (See Comments) 10/12/2014    Family History  Problem Relation Age of Onset  . Gout Father   . Hyperlipidemia Father   . Asthma Brother   . Colon cancer Paternal Grandfather     Social History   Social History  . Marital status: Single    Spouse name: N/A  . Number of children: N/A  . Years of education: N/A   Occupational History  . Not on file.   Social History Main  Topics  . Smoking status: Never Smoker  . Smokeless tobacco: Never Used  . Alcohol use 4.8 oz/week    8 Cans of beer per week     Comment:    . Drug use: No  . Sexual activity: Not on file   Other Topics Concern  . Not on file   Social History Narrative  . No narrative on file    Review of Systems: See HPI, otherwise negative ROS  Physical Exam: BP 107/70   Pulse 69   Temp (!) 96.8 F (36 C) (Tympanic)   Resp 16   Ht 6\' 4"  (1.93 m)   Wt 185 lb (83.9 kg)   SpO2 100%   BMI 22.52 kg/m  General:   Alert,  pleasant and cooperative in NAD Head:  Normocephalic and atraumatic. Neck:  Supple; no masses or thyromegaly. Lungs:  Clear throughout to auscultation.    Heart:  Regular rate and rhythm. Abdomen:  Soft, nontender and nondistended. Normal bowel sounds, without guarding, and without rebound.   Neurologic:  Alert and  oriented x4;  grossly normal neurologically.  Impression/Plan: Perry Franklin Perry Franklin is here for an endoscopy and possible dilation  to be performed for dysphagia  Risks, benefits, limitations, and alternatives regarding  endoscopy have been reviewed with the patient.  Questions have been answered.  All parties agreeable.   [name], MD  06/29/2017, 8:57 AM

## 2017-07-02 ENCOUNTER — Encounter: Payer: Self-pay | Admitting: Gastroenterology

## 2017-07-02 NOTE — Anesthesia Postprocedure Evaluation (Signed)
Anesthesia Post Note  Patient: Perry Franklin  Procedure(s) Performed: Procedure(s) (LRB): ESOPHAGOGASTRODUODENOSCOPY (EGD) WITH PROPOFOL (N/A)  Patient location during evaluation: Endoscopy Anesthesia Type: General Level of consciousness: awake and alert Pain management: pain level controlled Vital Signs Assessment: post-procedure vital signs reviewed and stable Respiratory status: spontaneous breathing, nonlabored ventilation, respiratory function stable and patient connected to nasal cannula oxygen Cardiovascular status: blood pressure returned to baseline and stable Postop Assessment: no signs of nausea or vomiting Anesthetic complications: no     Last Vitals:  Vitals:   06/29/17 1032 06/29/17 1042  BP: 110/63 94/77  Pulse: (!) 58 63  Resp: 17 20  Temp:      Last Pain:  Vitals:   06/30/17 1140  TempSrc:   PainSc: 0-No pain                 Cleda MccreedyJoseph K Piscitello

## 2017-07-03 LAB — SURGICAL PATHOLOGY

## 2017-07-04 ENCOUNTER — Telehealth: Payer: Self-pay

## 2017-07-04 NOTE — Telephone Encounter (Signed)
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

## 2017-07-04 NOTE — Telephone Encounter (Signed)
Attempted contact on patient's cell phone; no answer, vm full.  LVM for patient callback on home phone for results per Dr. Tobi BastosAnna.  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

## 2017-07-06 ENCOUNTER — Telehealth: Payer: Self-pay | Admitting: Gastroenterology

## 2017-07-06 NOTE — Telephone Encounter (Signed)
Patients mother, Clarene Critchleyve, left a voice message returning a call. [phone]

## 2017-07-09 ENCOUNTER — Telehealth: Payer: Self-pay

## 2017-07-09 NOTE — Telephone Encounter (Signed)
Pts mother returned call to discuss her sons results.  Her son has Eosinophilic  Esophagitis however his follow up appt is on 08/06/17- this conflicts with him returning back to college.  Is there any way we can move this appt up sooner for earlier treatment and school?  Please advise.  Thanks.

## 2017-07-09 NOTE — Telephone Encounter (Signed)
Get him in sooner

## 2017-07-09 NOTE — Telephone Encounter (Signed)
Advised patient of results per Dr. Tobi BastosAnna.   Informed patient that his mother was calling for information.   Patient stated "You can tell her whatever you want when she calls back".

## 2017-07-10 ENCOUNTER — Telehealth: Payer: Self-pay

## 2017-07-10 NOTE — Telephone Encounter (Signed)
Opened note in error.

## 2017-07-18 NOTE — Progress Notes (Signed)
   Wyline MoodKiran Mickeal Daws MD, MRCP(U.K) [address]  [address]  [place], [place] [address]  Main: [phone]  Fax: [phone]   Primary Care Physician: Patient, No Pcp Per  Primary Gastroenterologist:  Dr. Wyline MoodKiran Mariyah Upshaw   No chief complaint on file.   HPI: Perry Franklin is a 21 y.o. male    He is here today to discuss results of his recent EGD which he had for dysphagia.    He has had dysphagia for years, suffers from Asthma , hasnt been evaluated previously.  I performed an EGD on 06/29/17 showed gastritis and endoscopic features suggestive of eosinophilic esophagitis which was confirmed on biopsy with >65 eosinophils per high powered field. He was advised on his initial visit to commence ona PPI  But he hadnt .    Current Outpatient Prescriptions  Medication Sig Dispense Refill  . acetaminophen (TYLENOL) 325 MG tablet Take 650 mg by mouth every 6 (six) hours as needed.    Marland Kitchen. albuterol (PROVENTIL HFA;VENTOLIN HFA) 108 (90 Base) MCG/ACT inhaler Inhale into the lungs every 6 (six) hours as needed for wheezing or shortness of breath.    . cyclobenzaprine (FLEXERIL) 10 MG tablet Take by mouth.    . doxycycline (VIBRA-TABS) 100 MG tablet Take by mouth.     No current facility-administered medications for this visit.     Allergies as of 07/19/2017 - Review Complete 06/29/2017  Allergen Reaction Noted  . Cefprozil Other (See Comments) 10/12/2014    ROS:  General: Negative for anorexia, weight loss, fever, chills, fatigue, weakness. ENT: Negative for hoarseness, difficulty swallowing , nasal congestion. CV: Negative for chest pain, angina, palpitations, dyspnea on exertion, peripheral edema.  Respiratory: Negative for dyspnea at rest, dyspnea on exertion, cough, sputum, wheezing.  GI: See history of present illness. GU:  Negative for dysuria, hematuria, urinary incontinence, urinary frequency, nocturnal urination.  Endo: Negative for unusual weight change.    Physical  Examination:   BP 111/72   Pulse 65   Temp 97.7 F (36.5 C) (Oral)   Ht 6\' 1"  (1.854 m)   Wt 183 lb (83 kg)   BMI 24.14 kg/m   General: Well-nourished, well-developed in no acute distress.  Eyes: No icterus. Conjunctivae pink. Mouth: Oropharyngeal mucosa moist and pink , no lesions erythema or exudate. Lungs: Clear to auscultation bilaterally. Non-labored. Heart: Regular rate and rhythm, no murmurs rubs or gallops.  Abdomen: Bowel sounds are normal, nontender, nondistended, no hepatosplenomegaly or masses, no abdominal bruits or hernia , no rebound or guarding.    Extremities: No lower extremity edema. No clubbing or deformities. Neuro: Alert and oriented x 3.  Grossly intact. Skin: Warm and dry, no jaundice.   Psych: Alert and cooperative, normal mood and affect.   Imaging Studies: No results found.  Assessment and Plan:   Perry Franklin is a 21 y.o. y/o male here to follow up for dysphagia. EGD with biopsies off PPI show high eosinophil count. Explained the next step is to treat for 8 weeks with PPI to see if he has PPI-ROE and repeat EGD,  if still has eosinophils then likely has eosinophilic esophagitis.    I have discussed alternative options, risks & benefits,  which include, but are not limited to, bleeding, infection, perforation,respiratory complication & drug reaction.  The patient agrees with this plan & written consent will be obtained.     Dr Wyline MoodKiran Leeann Bady  MD,MRCP [facility](U.K) Follow up in 9 weeks

## 2017-07-19 ENCOUNTER — Other Ambulatory Visit: Payer: Self-pay

## 2017-07-19 ENCOUNTER — Ambulatory Visit: Payer: 59 | Admitting: Gastroenterology

## 2017-07-19 ENCOUNTER — Ambulatory Visit (INDEPENDENT_AMBULATORY_CARE_PROVIDER_SITE_OTHER): Payer: 59 | Admitting: Gastroenterology

## 2017-07-19 ENCOUNTER — Encounter: Payer: Self-pay | Admitting: Gastroenterology

## 2017-07-19 VITALS — BP 111/72 | HR 65 | Temp 97.7°F | Ht 73.0 in | Wt 183.0 lb

## 2017-07-19 DIAGNOSIS — R1310 Dysphagia, unspecified: Secondary | ICD-10-CM

## 2017-07-19 MED ORDER — OMEPRAZOLE 40 MG PO CPDR: 40.0000 mg | DELAYED_RELEASE_CAPSULE | Freq: Every day | ORAL | 0 refills | Status: AC

## 2017-07-19 NOTE — Addendum Note (Signed)
Addended by: Ardyth ManARTER, Annaka Cleaver Z on: 07/19/2017 03:08 PM   Modules accepted: Orders, SmartSet

## 2017-08-06 ENCOUNTER — Ambulatory Visit: Payer: 59 | Admitting: Gastroenterology

## 2017-09-13 ENCOUNTER — Encounter
Admission: RE | Disposition: A | Discharge status: Home / Self Care | Payer: Self-pay | Source: Ambulatory Visit | Attending: Gastroenterology

## 2017-09-13 ENCOUNTER — Ambulatory Visit: Payer: 59 | Admitting: Anesthesiology

## 2017-09-13 ENCOUNTER — Ambulatory Visit
Admission: RE | Admit: 2017-09-13 | Discharge: 2017-09-13 | Disposition: A | Discharge status: Home / Self Care | Payer: 59 | Source: Ambulatory Visit | Attending: Gastroenterology | Admitting: Gastroenterology

## 2017-09-13 DIAGNOSIS — Z79899 Other long term (current) drug therapy: Secondary | ICD-10-CM | POA: Diagnosis not present

## 2017-09-13 DIAGNOSIS — Z800 Family history of malignant neoplasm of digestive organs: Secondary | ICD-10-CM | POA: Diagnosis not present

## 2017-09-13 DIAGNOSIS — Z888 Allergy status to other drugs, medicaments and biological substances status: Secondary | ICD-10-CM | POA: Diagnosis not present

## 2017-09-13 DIAGNOSIS — K200 Eosinophilic esophagitis: Secondary | ICD-10-CM | POA: Diagnosis not present

## 2017-09-13 DIAGNOSIS — R1310 Dysphagia, unspecified: Secondary | ICD-10-CM | POA: Insufficient documentation

## 2017-09-13 DIAGNOSIS — J4520 Mild intermittent asthma, uncomplicated: Secondary | ICD-10-CM | POA: Insufficient documentation

## 2017-09-13 HISTORY — PX: ESOPHAGOGASTRODUODENOSCOPY (EGD) WITH PROPOFOL: SHX5813

## 2017-09-13 SURGERY — ESOPHAGOGASTRODUODENOSCOPY (EGD) WITH PROPOFOL
Anesthesia: General

## 2017-09-13 MED ORDER — FENTANYL CITRATE (PF) 100 MCG/2ML IJ SOLN
INTRAMUSCULAR | Status: AC
  Filled 2017-09-13: qty 2

## 2017-09-13 MED ORDER — MIDAZOLAM HCL 2 MG/2ML IJ SOLN
INTRAMUSCULAR | Status: AC
  Filled 2017-09-13: qty 2

## 2017-09-13 MED ORDER — GLYCOPYRROLATE 0.2 MG/ML IJ SOLN
INTRAMUSCULAR | Status: AC
  Filled 2017-09-13: qty 1

## 2017-09-13 MED ORDER — PROPOFOL 500 MG/50ML IV EMUL
INTRAVENOUS | Status: AC
  Filled 2017-09-13: qty 50

## 2017-09-13 MED ORDER — MIDAZOLAM HCL 2 MG/2ML IJ SOLN
INTRAMUSCULAR | Status: DC | PRN
  Administered 2017-09-13: 2 mg via INTRAVENOUS

## 2017-09-13 MED ORDER — LIDOCAINE HCL (CARDIAC) 20 MG/ML IV SOLN
INTRAVENOUS | Status: DC | PRN
  Administered 2017-09-13: 30 mg via INTRAVENOUS

## 2017-09-13 MED ORDER — BUTAMBEN-TETRACAINE-BENZOCAINE 2-2-14 % EX AERO
INHALATION_SPRAY | CUTANEOUS | Status: AC
  Filled 2017-09-13: qty 5

## 2017-09-13 MED ORDER — LIDOCAINE HCL (PF) 2 % IJ SOLN
INTRAMUSCULAR | Status: AC
  Filled 2017-09-13: qty 10

## 2017-09-13 MED ORDER — FENTANYL CITRATE (PF) 100 MCG/2ML IJ SOLN
INTRAMUSCULAR | Status: DC | PRN
  Administered 2017-09-13: 50 ug via INTRAVENOUS

## 2017-09-13 MED ORDER — SODIUM CHLORIDE 0.9 % IV SOLN
INTRAVENOUS | Status: DC
  Administered 2017-09-13: 08:00:00 via INTRAVENOUS

## 2017-09-13 MED ORDER — PROPOFOL 500 MG/50ML IV EMUL
INTRAVENOUS | Status: DC | PRN
  Administered 2017-09-13: 140 ug/kg/min via INTRAVENOUS

## 2017-09-13 MED ORDER — GLYCOPYRROLATE 0.2 MG/ML IJ SOLN
INTRAMUSCULAR | Status: DC | PRN
  Administered 2017-09-13: 0.1 mg via INTRAVENOUS

## 2017-09-13 NOTE — Anesthesia Postprocedure Evaluation (Signed)
Anesthesia Post Note  Patient: Perry Franklin  Procedure(s) Performed: ESOPHAGOGASTRODUODENOSCOPY (EGD) WITH PROPOFOL (N/A )  Patient location during evaluation: Endoscopy Anesthesia Type: General Level of consciousness: awake and alert and oriented Pain management: pain level controlled Vital Signs Assessment: post-procedure vital signs reviewed and stable Respiratory status: spontaneous breathing, nonlabored ventilation and respiratory function stable Cardiovascular status: blood pressure returned to baseline and stable Postop Assessment: no signs of nausea or vomiting Anesthetic complications: no     Last Vitals:  Vitals:   09/13/17 0922 09/13/17 0932  BP: (!) 92/56 116/78  Pulse: (!) 56 66  Resp: 14 16  Temp:    SpO2: 99% 99%    Last Pain:  Vitals:   09/13/17 0912  TempSrc: Tympanic                 Olie Scaffidi

## 2017-09-13 NOTE — Anesthesia Preprocedure Evaluation (Signed)
Anesthesia Evaluation  Patient identified by MRN, date of birth, ID band Patient awake    Reviewed: Allergy & Precautions, NPO status , Patient's Chart, lab work & pertinent test results  History of Anesthesia Complications Negative for: history of anesthetic complications  Airway Mallampati: I  TM Distance: >3 FB Neck ROM: Full    Dental no notable dental hx.    Pulmonary asthma (mild intermittent, rarely uses inhaler) , neg sleep apnea, neg COPD,    breath sounds clear to auscultation- rhonchi (-) wheezing      Cardiovascular Exercise Tolerance: Good (-) hypertension(-) CAD and (-) Past MI  Rhythm:Regular Rate:Normal - Systolic murmurs and - Diastolic murmurs    Neuro/Psych negative neurological ROS  negative psych ROS   GI/Hepatic negative GI ROS, Neg liver ROS,   Endo/Other  negative endocrine ROSneg diabetes  Renal/GU negative Renal ROS     Musculoskeletal negative musculoskeletal ROS (+)   Abdominal (+) - obese,   Peds  Hematology negative hematology ROS (+)   Anesthesia Other Findings   Reproductive/Obstetrics                             Anesthesia Physical Anesthesia Plan  ASA: II  Anesthesia Plan: General   Post-op Pain Management:    Induction: Intravenous  PONV Risk Score and Plan: 1 and Propofol infusion  Airway Management Planned: Natural Airway  Additional Equipment:   Intra-op Plan:   Post-operative Plan:   Informed Consent: I have reviewed the patients History and Physical, chart, labs and discussed the procedure including the risks, benefits and alternatives for the proposed anesthesia with the patient or authorized representative who has indicated his/her understanding and acceptance.   Dental advisory given  Plan Discussed with: CRNA and Anesthesiologist  Anesthesia Plan Comments:         Anesthesia Quick Evaluation

## 2017-09-13 NOTE — Op Note (Signed)
[facility] Patient Name: Perry Franklin Procedure Date: 09/13/2017 8:50 AM MRN: [id number] Account #: [id number] Date of Birth: September 22, 1996 Admit Type: Outpatient Age: 21 Room: [facility] ENDO ROOM 3 Gender: Male Note Status: Finalized Procedure:            Upper GI endoscopy Indications:          Dysphagia Providers:            Wyline Mood MD, MD Medicines:            Monitored Anesthesia Care Complications:        No immediate complications. Procedure:            Pre-Anesthesia Assessment:                       - Prior to the procedure, a History and Physical was                        performed, and patient medications, allergies and                        sensitivities were reviewed. The patient's tolerance of                        previous anesthesia was reviewed.                       - The risks and benefits of the procedure and the                        sedation options and risks were discussed with the                        patient. All questions were answered and informed                        consent was obtained.                       - ASA Grade Assessment: II - A patient with mild                        systemic disease.                       After obtaining informed consent, the endoscope was                        passed under direct vision. Throughout the procedure,                        the patient's blood pressure, pulse, and oxygen                        saturations were monitored continuously. The Endoscope                        was introduced through the mouth, and advanced to the                        third part of duodenum. The upper GI endoscopy was  accomplished with ease. The patient tolerated the                        procedure well. Findings:      The examined duodenum was normal.      The stomach was normal.      Mucosal changes including longitudinal furrows were found in the entire   esophagus. Esophageal findings were graded using the Eosinophilic       Esophagitis Endoscopic Reference Score (EoE-EREFS) as: Edema Grade 0       Normal (distinct vascular markings), Rings Grade 0 None (no ridges or       rings seen), Exudates Grade 0 None (no white lesions seen), Furrows       Grade 1 Present (vertical lines with or without visible depth) and       Stricture none (no stricture found). Biopsies were obtained from the       proximal and distal esophagus with cold forceps for histology of       suspected eosinophilic esophagitis.      The cardia and gastric fundus were normal on retroflexion. Impression:           - Normal examined duodenum.                       - Normal stomach.                       - Esophageal mucosal changes suggestive of eosinophilic                        esophagitis. Biopsied. Recommendation:       - Discharge patient to home (with escort).                       - Resume previous diet.                       - Continue present medications.                       - Await pathology results.                       - Return to my office in 4 weeks. Procedure Code(s):    --- Professional ---                       [phone], Esophagogastroduodenoscopy, flexible, transoral;                        with biopsy, single or multiple Diagnosis Code(s):    --- Professional ---                       K20.9, Esophagitis, unspecified                       R13.10, Dysphagia, unspecified CPT copyright 2016 American Medical Association. All rights reserved. The codes documented in this report are preliminary and upon coder review may  be revised to meet current compliance requirements. Wyline Mood, MD Wyline Mood MD, MD 09/13/2017 9:05:47 AM This report has been signed electronically. Number of Addenda: 0 Note Initiated On: 09/13/2017 8:50 AM      [facility]

## 2017-09-13 NOTE — Anesthesia Post-op Follow-up Note (Signed)
Anesthesia QCDR form completed.        

## 2017-09-13 NOTE — Transfer of Care (Signed)
Immediate Anesthesia Transfer of Care Note  Patient: Perry Franklin  Procedure(s) Performed: ESOPHAGOGASTRODUODENOSCOPY (EGD) WITH PROPOFOL (N/A )  Patient Location: PACU  Anesthesia Type:General  Level of Consciousness: awake and sedated  Airway & Oxygen Therapy: Patient Spontanous Breathing and Patient connected to nasal cannula oxygen  Post-op Assessment: Report given to RN and Post -op Vital signs reviewed and stable  Post vital signs: Reviewed and stable  Last Vitals:  Vitals:   09/13/17 0752  BP: 115/73  Pulse: (!) 59  Resp: 16  Temp: (!) 36.1 C  SpO2: 100%    Last Pain:  Vitals:   09/13/17 0752  TempSrc: Tympanic         Complications: No apparent anesthesia complications

## 2017-09-13 NOTE — Anesthesia Procedure Notes (Signed)
Performed by: Tonia Ghent Pre-anesthesia Checklist: Patient identified, Emergency Drugs available, Patient being monitored, Suction available and Timeout performed Patient Re-evaluated:Patient Re-evaluated prior to induction Oxygen Delivery Method: Nasal cannula Preoxygenation: Pre-oxygenation with 100% oxygen Induction Type: IV induction Airway Equipment and Method: Bite block Placement Confirmation: positive ETCO2 and CO2 detector

## 2017-09-13 NOTE — H&P (Signed)
Wyline Mood MD [address], [address] [place], [place] [address] Phone: [phone] Fax : [phone]  Primary Care Physician:  Patient, No Pcp Per Primary Gastroenterologist:  Dr. Wyline Mood   Pre-Procedure History & Physical: HPI:  Perry Franklin is a 21 y.o. male is here for an endoscopy.   Past Medical History:  Diagnosis Date  . Allergy   . Asthma     Past Surgical History:  Procedure Laterality Date  . ESOPHAGOGASTRODUODENOSCOPY (EGD) WITH PROPOFOL N/A 06/29/2017   Procedure: ESOPHAGOGASTRODUODENOSCOPY (EGD) WITH PROPOFOL;  Surgeon: Wyline Mood, MD;  Location: [facility] ENDOSCOPY;  Service: Endoscopy;  Laterality: N/A;  . NO PAST SURGERIES     verified with patient 11/01/16  . SEPTOPLASTY Right 11/23/2016   Procedure: SEPTOPLASTY;  Surgeon: Vernie Murders, MD;  Location: [facility];  Service: ENT;  Laterality: Right;  . TURBINATE REDUCTION Right 11/23/2016   Procedure: RIGHT ENDOSCOPIC MIDDLE TURBINATE AND INFERIOR REDUCTION;  Surgeon: Vernie Murders, MD;  Location: [facility];  Service: ENT;  Laterality: Right;    Prior to Admission medications   Medication Sig Start Date End Date Taking? Authorizing Provider  acetaminophen (TYLENOL) 325 MG tablet Take 650 mg by mouth every 6 (six) hours as needed.   Yes [provider]  omeprazole (PRILOSEC) 40 MG capsule Take 1 capsule (40 mg total) by mouth daily. 07/19/17 10/19/17 Yes Wyline Mood, MD  albuterol (PROVENTIL HFA;VENTOLIN HFA) 108 (90 Base) MCG/ACT inhaler Inhale into the lungs every 6 (six) hours as needed for wheezing or shortness of breath.    [provider]  cyclobenzaprine (FLEXERIL) 10 MG tablet Take by mouth. 10/09/14   [provider]  doxycycline (VIBRA-TABS) 100 MG tablet Take by mouth. 10/09/14   [provider]    Allergies as of 07/19/2017 - Review Complete 07/19/2017  Allergen Reaction Noted  . Cefprozil Other (See Comments) 10/12/2014    Family History    Problem Relation Age of Onset  . Gout Father   . Hyperlipidemia Father   . Asthma Brother   . Colon cancer Paternal Grandfather     Social History   Social History  . Marital status: Single    Spouse name: N/A  . Number of children: N/A  . Years of education: N/A   Occupational History  . Not on file.   Social History Main Topics  . Smoking status: Never Smoker  . Smokeless tobacco: Never Used  . Alcohol use 4.8 oz/week    8 Cans of beer per week     Comment:    . Drug use: No  . Sexual activity: Not on file   Other Topics Concern  . Not on file   Social History Narrative  . No narrative on file    Review of Systems: See HPI, otherwise negative ROS  Physical Exam: BP 115/73   Pulse (!) 59   Temp (!) 96.9 F (36.1 C) (Tympanic)   Resp 16   Ht  (1.93 m)   Wt 185 lb (83.9 kg)   SpO2 100%   BMI 22.52 kg/m  General:   Alert,  pleasant and cooperative in NAD Head:  Normocephalic and atraumatic. Neck:  Supple; no masses or thyromegaly. Lungs:  Clear throughout to auscultation.    Heart:  Regular rate and rhythm. Abdomen:  Soft, nontender and nondistended. Normal bowel sounds, without guarding, and without rebound.   Neurologic:  Alert and  oriented x4;  grossly normal neurologically.  Impression/Plan: Perry Franklin  is here for an endoscopy to be performed for dysphagia  Risks, benefits, limitations, and alternatives regarding  endoscopy have been reviewed with the patient.  Questions have been answered.  All parties agreeable.   Wyline Mood, MD  09/13/2017, 8:11 AM

## 2017-09-14 ENCOUNTER — Telehealth: Payer: Self-pay

## 2017-09-14 LAB — SURGICAL PATHOLOGY

## 2017-09-14 NOTE — Telephone Encounter (Signed)
-----   Message from Wyline Mood, MD sent at 09/14/2017 11:33 AM EDT ----- Biopsies confirm eosinophilic esophagitis 1. Will need referral to allergy specialist to help determine cause of allergy with testing  2. Office visit to discuss inhalers and plan of treatment

## 2017-09-14 NOTE — Telephone Encounter (Signed)
Attempted to contact patient. No answer.   Advised mother of results per Dr. Tobi Bastos.   Patient to callback and schedule OV for treatment plan.

## 2017-09-17 ENCOUNTER — Encounter: Payer: Self-pay | Admitting: Gastroenterology

## 2017-10-15 ENCOUNTER — Encounter: Payer: Self-pay | Admitting: Gastroenterology

## 2017-10-15 ENCOUNTER — Ambulatory Visit: Payer: 59 | Admitting: Gastroenterology

## 2017-10-15 ENCOUNTER — Encounter (INDEPENDENT_AMBULATORY_CARE_PROVIDER_SITE_OTHER): Payer: Self-pay

## 2017-10-15 VITALS — BP 108/66 | HR 90 | Temp 97.8°F | Ht 73.0 in | Wt 188.6 lb

## 2017-10-15 DIAGNOSIS — K200 Eosinophilic esophagitis: Secondary | ICD-10-CM

## 2017-10-15 NOTE — Progress Notes (Signed)
Wyline Mood MD, MRCP(U.K) [address]  [address]  [place], [place] [address]  Main: [phone]  Fax: [phone]   Primary Care Physician: Patient, No Pcp Per  Primary Gastroenterologist:  Dr. Wyline Mood   Chief Complaint  Patient presents with  . Follow-up    HPI: Perry Franklin is a 21 y.o. male    Summary of history : He is here to follow up for eosinophilic esophagitis. I performed an EGD on 06/29/17 showed gastritis and endoscopic features suggestive of eosinophilic esophagitis which was confirmed on biopsy with >65 eosinophils per high powered field. He was advised on his initial visit to commence ona PPI  But he hadnt    Interval history   07/19/2017-  10/15/2017    09/13/17- EGD - while on PPI - esophageal bx showed upto 25 eosinophils per high power field in proximal and distal esophageal mucosa.  Still has issues swallowing     Current Outpatient Medications  Medication Sig Dispense Refill  . albuterol (PROVENTIL HFA;VENTOLIN HFA) 108 (90 Base) MCG/ACT inhaler Inhale into the lungs every 6 (six) hours as needed for wheezing or shortness of breath.    . Cetirizine HCl (ZYRTEC ALLERGY) 10 MG CAPS Take by mouth.    Marland Kitchen omeprazole (PRILOSEC) 40 MG capsule Take 1 capsule (40 mg total) by mouth daily. 92 capsule 0  . acetaminophen (TYLENOL) 325 MG tablet Take 650 mg by mouth every 6 (six) hours as needed.    . cyclobenzaprine (FLEXERIL) 10 MG tablet Take by mouth.    . doxycycline (VIBRA-TABS) 100 MG tablet Take by mouth.    . naproxen sodium (ANAPROX) 550 MG tablet Take by mouth.     No current facility-administered medications for this visit.     Allergies as of 10/15/2017 - Review Complete 10/15/2017  Allergen Reaction Noted  . Cefprozil Other (See Comments) 10/12/2014    ROS:  General: Negative for anorexia, weight loss, fever, chills, fatigue, weakness. ENT: Negative for hoarseness, difficulty swallowing , nasal congestion. CV: Negative for chest  pain, angina, palpitations, dyspnea on exertion, peripheral edema.  Respiratory: Negative for dyspnea at rest, dyspnea on exertion, cough, sputum, wheezing.  GI: See history of present illness. GU:  Negative for dysuria, hematuria, urinary incontinence, urinary frequency, nocturnal urination.  Endo: Negative for unusual weight change.    Physical Examination:   BP 108/66 (BP Location: Left Arm, Patient Position: Sitting, Cuff Size: Normal)   Pulse 90   Temp 97.8 F (36.6 C) (Oral)   Ht 6\' 1"  (1.854 m)   Wt 188 lb 9.6 oz (85.5 kg)   BMI 24.88 kg/m   General: Well-nourished, well-developed in no acute distress.  Eyes: No icterus. Conjunctivae pink. Mouth: Oropharyngeal mucosa moist and pink , no lesions erythema or exudate. Lungs: Clear to auscultation bilaterally. Non-labored. Heart: Regular rate and rhythm, no murmurs rubs or gallops.  Abdomen: Bowel sounds are normal, nontender, nondistended, no hepatosplenomegaly or masses, no abdominal bruits or hernia , no rebound or guarding.   Extremities: No lower extremity edema. No clubbing or deformities. Neuro: Alert and oriented x 3.  Grossly intact. Skin: Warm and dry, no jaundice.   Psych: Alert and cooperative, normal mood and affect.   Imaging Studies: No results found.  Assessment and Plan:   Perry Franklin is a 21 y.o. y/o male here to follow up for eosinophilic esophagitis.   Plan  1. Discussed etiology options to treat include indentifying the allergen , in the meanwhile a  course of swallowed steroid inhaler and asses response. 2. Viscous budesonide compounded by mixing two or four 0.5 mg/2 mL Pulmicort Respules with sucralose (Splenda; 10 1-gram packets per 1 mg of budesonide, creating a volume of approximately 8 mL). He  should not eat or drink for 30 minutes after taking the budesonide suspension.  3. Referral to allergy specialist to identify any cause for his EOE  Dr Wyline MoodKiran Shuntay Everetts  MD,MRCP [facility](U.K) Follow up in 8-10  weeks

## 2017-10-16 ENCOUNTER — Other Ambulatory Visit: Payer: Self-pay

## 2017-10-16 DIAGNOSIS — K200 Eosinophilic esophagitis: Secondary | ICD-10-CM

## 2017-10-16 MED ORDER — FLUTICASONE PROPIONATE HFA 220 MCG/ACT IN AERO: INHALATION_SPRAY | RESPIRATORY_TRACT | 12 refills | Status: AC

## 2017-10-16 NOTE — Progress Notes (Signed)
Advised patient that Rx was changed per Dr. Tobi BastosAnna.  This was more simple. Rx [facility] pharmacy.

## 2017-10-24 ENCOUNTER — Telehealth: Payer: Self-pay | Admitting: Gastroenterology

## 2017-10-24 ENCOUNTER — Other Ambulatory Visit: Payer: Self-pay

## 2017-10-24 NOTE — Telephone Encounter (Signed)
Perry Franklin called (mother) wondering about allergy specialist appointment. Please call her.

## 2017-10-25 ENCOUNTER — Other Ambulatory Visit: Payer: Self-pay

## 2017-10-25 DIAGNOSIS — K200 Eosinophilic esophagitis: Secondary | ICD-10-CM

## 2017-11-30 DIAGNOSIS — K200 Eosinophilic esophagitis: Secondary | ICD-10-CM | POA: Diagnosis not present

## 2017-11-30 DIAGNOSIS — J305 Allergic rhinitis due to food: Secondary | ICD-10-CM | POA: Diagnosis not present

## 2017-11-30 DIAGNOSIS — J3489 Other specified disorders of nose and nasal sinuses: Secondary | ICD-10-CM | POA: Diagnosis not present

## 2017-11-30 DIAGNOSIS — J301 Allergic rhinitis due to pollen: Secondary | ICD-10-CM | POA: Diagnosis not present

## 2018-02-19 ENCOUNTER — Telehealth: Payer: Self-pay | Admitting: Gastroenterology

## 2018-02-19 DIAGNOSIS — Z0000 Encounter for general adult medical examination without abnormal findings: Secondary | ICD-10-CM | POA: Diagnosis not present

## 2018-02-19 DIAGNOSIS — K200 Eosinophilic esophagitis: Secondary | ICD-10-CM | POA: Diagnosis not present

## 2018-02-19 NOTE — Telephone Encounter (Signed)
Mom(Perry Franklin)called and stated the son was through with his inhaler.She would like Dr Tobi BastosAnna to call in the medication the pharmacy had to mix to coat son's throat.[facility].

## 2018-02-20 ENCOUNTER — Telehealth: Payer: Self-pay

## 2018-02-20 NOTE — Telephone Encounter (Signed)
ERROR

## 2018-02-20 NOTE — Telephone Encounter (Signed)
Returned patient's Mother's call.  LVM for return call concerning compound mixture Dr. Tobi BastosAnna previously prescribed.   The compound Pharmacies in [place] have closed. Nearest pharmacy is [facility] in [place].

## 2018-02-21 DIAGNOSIS — L700 Acne vulgaris: Secondary | ICD-10-CM | POA: Diagnosis not present

## 2018-02-21 DIAGNOSIS — L2089 Other atopic dermatitis: Secondary | ICD-10-CM | POA: Diagnosis not present

## 2018-02-21 DIAGNOSIS — L298 Other pruritus: Secondary | ICD-10-CM | POA: Diagnosis not present

## 2018-02-21 DIAGNOSIS — Q828 Other specified congenital malformations of skin: Secondary | ICD-10-CM | POA: Diagnosis not present

## 2018-02-22 ENCOUNTER — Other Ambulatory Visit: Payer: Self-pay

## 2018-02-22 DIAGNOSIS — K200 Eosinophilic esophagitis: Secondary | ICD-10-CM

## 2018-02-22 MED ORDER — PULMICORT 0.5 MG/2ML IN SUSP: RESPIRATORY_TRACT | 0 refills | Status: AC

## 2018-02-22 NOTE — Telephone Encounter (Signed)
LVM for mother stating that medication was ready at [facility] pharmacy.   If any questions callback on Monday.

## 2018-04-05 DIAGNOSIS — L298 Other pruritus: Secondary | ICD-10-CM | POA: Diagnosis not present

## 2018-04-05 DIAGNOSIS — L700 Acne vulgaris: Secondary | ICD-10-CM | POA: Diagnosis not present

## 2018-09-13 DIAGNOSIS — L700 Acne vulgaris: Secondary | ICD-10-CM | POA: Diagnosis not present

## 2018-09-13 DIAGNOSIS — L400 Psoriasis vulgaris: Secondary | ICD-10-CM | POA: Diagnosis not present

## 2018-10-25 DIAGNOSIS — B999 Unspecified infectious disease: Secondary | ICD-10-CM | POA: Diagnosis not present

## 2018-10-25 DIAGNOSIS — J4520 Mild intermittent asthma, uncomplicated: Secondary | ICD-10-CM | POA: Diagnosis not present

## 2018-10-25 DIAGNOSIS — K200 Eosinophilic esophagitis: Secondary | ICD-10-CM | POA: Diagnosis not present

## 2018-10-25 DIAGNOSIS — J301 Allergic rhinitis due to pollen: Secondary | ICD-10-CM | POA: Diagnosis not present

## 2018-11-04 DIAGNOSIS — J301 Allergic rhinitis due to pollen: Secondary | ICD-10-CM | POA: Diagnosis not present

## 2018-11-04 DIAGNOSIS — J3081 Allergic rhinitis due to animal (cat) (dog) hair and dander: Secondary | ICD-10-CM | POA: Diagnosis not present

## 2018-11-05 DIAGNOSIS — J3089 Other allergic rhinitis: Secondary | ICD-10-CM | POA: Diagnosis not present

## 2018-11-19 DIAGNOSIS — R1314 Dysphagia, pharyngoesophageal phase: Secondary | ICD-10-CM | POA: Diagnosis not present

## 2018-11-19 DIAGNOSIS — K200 Eosinophilic esophagitis: Secondary | ICD-10-CM | POA: Diagnosis not present

## 2018-11-28 DIAGNOSIS — J3081 Allergic rhinitis due to animal (cat) (dog) hair and dander: Secondary | ICD-10-CM | POA: Diagnosis not present

## 2018-11-28 DIAGNOSIS — J3089 Other allergic rhinitis: Secondary | ICD-10-CM | POA: Diagnosis not present

## 2018-11-28 DIAGNOSIS — J301 Allergic rhinitis due to pollen: Secondary | ICD-10-CM | POA: Diagnosis not present

## 2018-12-06 DIAGNOSIS — J3089 Other allergic rhinitis: Secondary | ICD-10-CM | POA: Diagnosis not present

## 2018-12-06 DIAGNOSIS — J301 Allergic rhinitis due to pollen: Secondary | ICD-10-CM | POA: Diagnosis not present

## 2018-12-06 DIAGNOSIS — J3081 Allergic rhinitis due to animal (cat) (dog) hair and dander: Secondary | ICD-10-CM | POA: Diagnosis not present

## 2018-12-13 DIAGNOSIS — J301 Allergic rhinitis due to pollen: Secondary | ICD-10-CM | POA: Diagnosis not present

## 2018-12-13 DIAGNOSIS — J3089 Other allergic rhinitis: Secondary | ICD-10-CM | POA: Diagnosis not present

## 2018-12-23 DIAGNOSIS — Z516 Encounter for desensitization to allergens: Secondary | ICD-10-CM | POA: Diagnosis not present

## 2018-12-27 DIAGNOSIS — Z516 Encounter for desensitization to allergens: Secondary | ICD-10-CM | POA: Diagnosis not present

## 2019-01-01 DIAGNOSIS — Z516 Encounter for desensitization to allergens: Secondary | ICD-10-CM | POA: Diagnosis not present

## 2019-01-06 DIAGNOSIS — K3189 Other diseases of stomach and duodenum: Secondary | ICD-10-CM | POA: Diagnosis not present

## 2019-01-06 DIAGNOSIS — R1310 Dysphagia, unspecified: Secondary | ICD-10-CM | POA: Diagnosis not present

## 2019-01-06 DIAGNOSIS — Z79899 Other long term (current) drug therapy: Secondary | ICD-10-CM | POA: Diagnosis not present

## 2019-01-06 DIAGNOSIS — K200 Eosinophilic esophagitis: Secondary | ICD-10-CM | POA: Diagnosis not present

## 2019-01-08 DIAGNOSIS — Z516 Encounter for desensitization to allergens: Secondary | ICD-10-CM | POA: Diagnosis not present

## 2019-01-15 DIAGNOSIS — Z516 Encounter for desensitization to allergens: Secondary | ICD-10-CM | POA: Diagnosis not present

## 2019-01-22 DIAGNOSIS — Z516 Encounter for desensitization to allergens: Secondary | ICD-10-CM | POA: Diagnosis not present

## 2019-01-29 DIAGNOSIS — Z516 Encounter for desensitization to allergens: Secondary | ICD-10-CM | POA: Diagnosis not present

## 2019-02-05 DIAGNOSIS — Z516 Encounter for desensitization to allergens: Secondary | ICD-10-CM | POA: Diagnosis not present

## 2019-02-12 DIAGNOSIS — Z516 Encounter for desensitization to allergens: Secondary | ICD-10-CM | POA: Diagnosis not present

## 2019-02-26 DIAGNOSIS — Z516 Encounter for desensitization to allergens: Secondary | ICD-10-CM | POA: Diagnosis not present

## 2019-03-07 DIAGNOSIS — J3081 Allergic rhinitis due to animal (cat) (dog) hair and dander: Secondary | ICD-10-CM | POA: Diagnosis not present

## 2019-03-07 DIAGNOSIS — J3089 Other allergic rhinitis: Secondary | ICD-10-CM | POA: Diagnosis not present

## 2019-03-07 DIAGNOSIS — J301 Allergic rhinitis due to pollen: Secondary | ICD-10-CM | POA: Diagnosis not present

## 2019-03-14 DIAGNOSIS — J3089 Other allergic rhinitis: Secondary | ICD-10-CM | POA: Diagnosis not present

## 2019-03-14 DIAGNOSIS — J3081 Allergic rhinitis due to animal (cat) (dog) hair and dander: Secondary | ICD-10-CM | POA: Diagnosis not present

## 2019-03-14 DIAGNOSIS — J301 Allergic rhinitis due to pollen: Secondary | ICD-10-CM | POA: Diagnosis not present

## 2019-03-28 DIAGNOSIS — J3081 Allergic rhinitis due to animal (cat) (dog) hair and dander: Secondary | ICD-10-CM | POA: Diagnosis not present

## 2019-03-28 DIAGNOSIS — J301 Allergic rhinitis due to pollen: Secondary | ICD-10-CM | POA: Diagnosis not present

## 2019-03-28 DIAGNOSIS — J3089 Other allergic rhinitis: Secondary | ICD-10-CM | POA: Diagnosis not present

## 2019-04-01 DIAGNOSIS — J301 Allergic rhinitis due to pollen: Secondary | ICD-10-CM | POA: Diagnosis not present

## 2019-04-01 DIAGNOSIS — J3081 Allergic rhinitis due to animal (cat) (dog) hair and dander: Secondary | ICD-10-CM | POA: Diagnosis not present

## 2019-04-01 DIAGNOSIS — J3089 Other allergic rhinitis: Secondary | ICD-10-CM | POA: Diagnosis not present

## 2019-04-04 DIAGNOSIS — J3089 Other allergic rhinitis: Secondary | ICD-10-CM | POA: Diagnosis not present

## 2019-04-04 DIAGNOSIS — J301 Allergic rhinitis due to pollen: Secondary | ICD-10-CM | POA: Diagnosis not present

## 2019-04-04 DIAGNOSIS — J3081 Allergic rhinitis due to animal (cat) (dog) hair and dander: Secondary | ICD-10-CM | POA: Diagnosis not present

## 2019-04-11 DIAGNOSIS — J301 Allergic rhinitis due to pollen: Secondary | ICD-10-CM | POA: Diagnosis not present

## 2019-04-11 DIAGNOSIS — J3081 Allergic rhinitis due to animal (cat) (dog) hair and dander: Secondary | ICD-10-CM | POA: Diagnosis not present

## 2019-04-11 DIAGNOSIS — J3089 Other allergic rhinitis: Secondary | ICD-10-CM | POA: Diagnosis not present

## 2019-04-17 DIAGNOSIS — J301 Allergic rhinitis due to pollen: Secondary | ICD-10-CM | POA: Diagnosis not present

## 2019-04-17 DIAGNOSIS — J3081 Allergic rhinitis due to animal (cat) (dog) hair and dander: Secondary | ICD-10-CM | POA: Diagnosis not present

## 2019-04-17 DIAGNOSIS — J3089 Other allergic rhinitis: Secondary | ICD-10-CM | POA: Diagnosis not present

## 2019-04-25 DIAGNOSIS — J3081 Allergic rhinitis due to animal (cat) (dog) hair and dander: Secondary | ICD-10-CM | POA: Diagnosis not present

## 2019-04-25 DIAGNOSIS — J301 Allergic rhinitis due to pollen: Secondary | ICD-10-CM | POA: Diagnosis not present

## 2019-04-25 DIAGNOSIS — J3089 Other allergic rhinitis: Secondary | ICD-10-CM | POA: Diagnosis not present

## 2019-05-09 DIAGNOSIS — J3089 Other allergic rhinitis: Secondary | ICD-10-CM | POA: Diagnosis not present

## 2019-05-09 DIAGNOSIS — J3081 Allergic rhinitis due to animal (cat) (dog) hair and dander: Secondary | ICD-10-CM | POA: Diagnosis not present

## 2019-05-09 DIAGNOSIS — J301 Allergic rhinitis due to pollen: Secondary | ICD-10-CM | POA: Diagnosis not present

## 2019-05-16 DIAGNOSIS — J301 Allergic rhinitis due to pollen: Secondary | ICD-10-CM | POA: Diagnosis not present

## 2019-05-16 DIAGNOSIS — J3089 Other allergic rhinitis: Secondary | ICD-10-CM | POA: Diagnosis not present

## 2019-05-16 DIAGNOSIS — J3081 Allergic rhinitis due to animal (cat) (dog) hair and dander: Secondary | ICD-10-CM | POA: Diagnosis not present

## 2019-05-23 DIAGNOSIS — J301 Allergic rhinitis due to pollen: Secondary | ICD-10-CM | POA: Diagnosis not present

## 2019-05-23 DIAGNOSIS — J3081 Allergic rhinitis due to animal (cat) (dog) hair and dander: Secondary | ICD-10-CM | POA: Diagnosis not present

## 2019-05-23 DIAGNOSIS — J3089 Other allergic rhinitis: Secondary | ICD-10-CM | POA: Diagnosis not present

## 2019-05-30 DIAGNOSIS — J301 Allergic rhinitis due to pollen: Secondary | ICD-10-CM | POA: Diagnosis not present

## 2019-05-30 DIAGNOSIS — J3089 Other allergic rhinitis: Secondary | ICD-10-CM | POA: Diagnosis not present

## 2019-06-06 DIAGNOSIS — J3089 Other allergic rhinitis: Secondary | ICD-10-CM | POA: Diagnosis not present

## 2019-06-06 DIAGNOSIS — J3081 Allergic rhinitis due to animal (cat) (dog) hair and dander: Secondary | ICD-10-CM | POA: Diagnosis not present

## 2019-06-06 DIAGNOSIS — J301 Allergic rhinitis due to pollen: Secondary | ICD-10-CM | POA: Diagnosis not present

## 2019-06-20 DIAGNOSIS — J3089 Other allergic rhinitis: Secondary | ICD-10-CM | POA: Diagnosis not present

## 2019-06-20 DIAGNOSIS — J3081 Allergic rhinitis due to animal (cat) (dog) hair and dander: Secondary | ICD-10-CM | POA: Diagnosis not present

## 2019-06-20 DIAGNOSIS — J301 Allergic rhinitis due to pollen: Secondary | ICD-10-CM | POA: Diagnosis not present

## 2019-06-27 DIAGNOSIS — J301 Allergic rhinitis due to pollen: Secondary | ICD-10-CM | POA: Diagnosis not present

## 2019-06-27 DIAGNOSIS — J3081 Allergic rhinitis due to animal (cat) (dog) hair and dander: Secondary | ICD-10-CM | POA: Diagnosis not present

## 2019-06-27 DIAGNOSIS — J3089 Other allergic rhinitis: Secondary | ICD-10-CM | POA: Diagnosis not present

## 2019-07-03 DIAGNOSIS — J301 Allergic rhinitis due to pollen: Secondary | ICD-10-CM | POA: Diagnosis not present

## 2019-07-03 DIAGNOSIS — J3081 Allergic rhinitis due to animal (cat) (dog) hair and dander: Secondary | ICD-10-CM | POA: Diagnosis not present

## 2019-07-03 DIAGNOSIS — J3089 Other allergic rhinitis: Secondary | ICD-10-CM | POA: Diagnosis not present

## 2019-07-18 DIAGNOSIS — J301 Allergic rhinitis due to pollen: Secondary | ICD-10-CM | POA: Diagnosis not present

## 2019-07-18 DIAGNOSIS — J3081 Allergic rhinitis due to animal (cat) (dog) hair and dander: Secondary | ICD-10-CM | POA: Diagnosis not present

## 2019-07-18 DIAGNOSIS — J3089 Other allergic rhinitis: Secondary | ICD-10-CM | POA: Diagnosis not present

## 2019-07-25 DIAGNOSIS — J3081 Allergic rhinitis due to animal (cat) (dog) hair and dander: Secondary | ICD-10-CM | POA: Diagnosis not present

## 2019-07-25 DIAGNOSIS — J301 Allergic rhinitis due to pollen: Secondary | ICD-10-CM | POA: Diagnosis not present

## 2019-07-25 DIAGNOSIS — J3089 Other allergic rhinitis: Secondary | ICD-10-CM | POA: Diagnosis not present

## 2019-07-30 DIAGNOSIS — K200 Eosinophilic esophagitis: Secondary | ICD-10-CM | POA: Diagnosis not present

## 2019-07-30 DIAGNOSIS — J309 Allergic rhinitis, unspecified: Secondary | ICD-10-CM | POA: Diagnosis not present

## 2019-07-30 DIAGNOSIS — J4520 Mild intermittent asthma, uncomplicated: Secondary | ICD-10-CM | POA: Diagnosis not present

## 2019-08-01 DIAGNOSIS — R1314 Dysphagia, pharyngoesophageal phase: Secondary | ICD-10-CM | POA: Diagnosis not present

## 2019-08-01 DIAGNOSIS — J301 Allergic rhinitis due to pollen: Secondary | ICD-10-CM | POA: Diagnosis not present

## 2019-08-01 DIAGNOSIS — J3081 Allergic rhinitis due to animal (cat) (dog) hair and dander: Secondary | ICD-10-CM | POA: Diagnosis not present

## 2019-08-01 DIAGNOSIS — J3089 Other allergic rhinitis: Secondary | ICD-10-CM | POA: Diagnosis not present

## 2019-08-08 DIAGNOSIS — J3089 Other allergic rhinitis: Secondary | ICD-10-CM | POA: Diagnosis not present

## 2019-08-08 DIAGNOSIS — J301 Allergic rhinitis due to pollen: Secondary | ICD-10-CM | POA: Diagnosis not present

## 2019-08-08 DIAGNOSIS — J3081 Allergic rhinitis due to animal (cat) (dog) hair and dander: Secondary | ICD-10-CM | POA: Diagnosis not present

## 2019-08-15 DIAGNOSIS — J301 Allergic rhinitis due to pollen: Secondary | ICD-10-CM | POA: Diagnosis not present

## 2019-08-15 DIAGNOSIS — J3089 Other allergic rhinitis: Secondary | ICD-10-CM | POA: Diagnosis not present

## 2019-08-15 DIAGNOSIS — J3081 Allergic rhinitis due to animal (cat) (dog) hair and dander: Secondary | ICD-10-CM | POA: Diagnosis not present

## 2019-08-22 DIAGNOSIS — J3081 Allergic rhinitis due to animal (cat) (dog) hair and dander: Secondary | ICD-10-CM | POA: Diagnosis not present

## 2019-08-22 DIAGNOSIS — J301 Allergic rhinitis due to pollen: Secondary | ICD-10-CM | POA: Diagnosis not present

## 2019-08-22 DIAGNOSIS — K200 Eosinophilic esophagitis: Secondary | ICD-10-CM | POA: Diagnosis not present

## 2019-08-22 DIAGNOSIS — J3089 Other allergic rhinitis: Secondary | ICD-10-CM | POA: Diagnosis not present

## 2019-08-25 DIAGNOSIS — J3089 Other allergic rhinitis: Secondary | ICD-10-CM | POA: Diagnosis not present

## 2019-08-25 DIAGNOSIS — J3081 Allergic rhinitis due to animal (cat) (dog) hair and dander: Secondary | ICD-10-CM | POA: Diagnosis not present

## 2019-08-25 DIAGNOSIS — J301 Allergic rhinitis due to pollen: Secondary | ICD-10-CM | POA: Diagnosis not present

## 2019-08-29 DIAGNOSIS — J3081 Allergic rhinitis due to animal (cat) (dog) hair and dander: Secondary | ICD-10-CM | POA: Diagnosis not present

## 2019-08-29 DIAGNOSIS — J3089 Other allergic rhinitis: Secondary | ICD-10-CM | POA: Diagnosis not present

## 2019-08-29 DIAGNOSIS — J301 Allergic rhinitis due to pollen: Secondary | ICD-10-CM | POA: Diagnosis not present

## 2019-09-03 DIAGNOSIS — J301 Allergic rhinitis due to pollen: Secondary | ICD-10-CM | POA: Diagnosis not present

## 2019-09-03 DIAGNOSIS — J3081 Allergic rhinitis due to animal (cat) (dog) hair and dander: Secondary | ICD-10-CM | POA: Diagnosis not present

## 2019-09-04 DIAGNOSIS — J3089 Other allergic rhinitis: Secondary | ICD-10-CM | POA: Diagnosis not present

## 2019-09-12 DIAGNOSIS — J3081 Allergic rhinitis due to animal (cat) (dog) hair and dander: Secondary | ICD-10-CM | POA: Diagnosis not present

## 2019-09-12 DIAGNOSIS — J3089 Other allergic rhinitis: Secondary | ICD-10-CM | POA: Diagnosis not present

## 2019-09-12 DIAGNOSIS — J301 Allergic rhinitis due to pollen: Secondary | ICD-10-CM | POA: Diagnosis not present

## 2019-09-16 DIAGNOSIS — J301 Allergic rhinitis due to pollen: Secondary | ICD-10-CM | POA: Diagnosis not present

## 2019-09-16 DIAGNOSIS — J3089 Other allergic rhinitis: Secondary | ICD-10-CM | POA: Diagnosis not present

## 2019-09-16 DIAGNOSIS — J3081 Allergic rhinitis due to animal (cat) (dog) hair and dander: Secondary | ICD-10-CM | POA: Diagnosis not present

## 2019-09-26 DIAGNOSIS — J3081 Allergic rhinitis due to animal (cat) (dog) hair and dander: Secondary | ICD-10-CM | POA: Diagnosis not present

## 2019-09-26 DIAGNOSIS — J3089 Other allergic rhinitis: Secondary | ICD-10-CM | POA: Diagnosis not present

## 2019-09-26 DIAGNOSIS — J301 Allergic rhinitis due to pollen: Secondary | ICD-10-CM | POA: Diagnosis not present

## 2019-10-06 DIAGNOSIS — J3081 Allergic rhinitis due to animal (cat) (dog) hair and dander: Secondary | ICD-10-CM | POA: Diagnosis not present

## 2019-10-06 DIAGNOSIS — J3089 Other allergic rhinitis: Secondary | ICD-10-CM | POA: Diagnosis not present

## 2019-10-06 DIAGNOSIS — J301 Allergic rhinitis due to pollen: Secondary | ICD-10-CM | POA: Diagnosis not present

## 2019-10-13 DIAGNOSIS — J301 Allergic rhinitis due to pollen: Secondary | ICD-10-CM | POA: Diagnosis not present

## 2019-10-13 DIAGNOSIS — J3089 Other allergic rhinitis: Secondary | ICD-10-CM | POA: Diagnosis not present

## 2019-10-13 DIAGNOSIS — J3081 Allergic rhinitis due to animal (cat) (dog) hair and dander: Secondary | ICD-10-CM | POA: Diagnosis not present

## 2019-10-24 DIAGNOSIS — J301 Allergic rhinitis due to pollen: Secondary | ICD-10-CM | POA: Diagnosis not present

## 2019-10-24 DIAGNOSIS — J3089 Other allergic rhinitis: Secondary | ICD-10-CM | POA: Diagnosis not present

## 2019-10-24 DIAGNOSIS — J3081 Allergic rhinitis due to animal (cat) (dog) hair and dander: Secondary | ICD-10-CM | POA: Diagnosis not present

## 2019-11-11 DIAGNOSIS — J3089 Other allergic rhinitis: Secondary | ICD-10-CM | POA: Diagnosis not present

## 2019-11-11 DIAGNOSIS — J3081 Allergic rhinitis due to animal (cat) (dog) hair and dander: Secondary | ICD-10-CM | POA: Diagnosis not present

## 2019-11-11 DIAGNOSIS — J301 Allergic rhinitis due to pollen: Secondary | ICD-10-CM | POA: Diagnosis not present

## 2019-11-26 DIAGNOSIS — J301 Allergic rhinitis due to pollen: Secondary | ICD-10-CM | POA: Diagnosis not present

## 2019-11-26 DIAGNOSIS — J3081 Allergic rhinitis due to animal (cat) (dog) hair and dander: Secondary | ICD-10-CM | POA: Diagnosis not present

## 2019-11-26 DIAGNOSIS — J3089 Other allergic rhinitis: Secondary | ICD-10-CM | POA: Diagnosis not present

## 2019-12-10 DIAGNOSIS — J301 Allergic rhinitis due to pollen: Secondary | ICD-10-CM | POA: Diagnosis not present

## 2019-12-10 DIAGNOSIS — J3081 Allergic rhinitis due to animal (cat) (dog) hair and dander: Secondary | ICD-10-CM | POA: Diagnosis not present

## 2019-12-10 DIAGNOSIS — J3089 Other allergic rhinitis: Secondary | ICD-10-CM | POA: Diagnosis not present

## 2019-12-24 DIAGNOSIS — J301 Allergic rhinitis due to pollen: Secondary | ICD-10-CM | POA: Diagnosis not present

## 2019-12-24 DIAGNOSIS — J3089 Other allergic rhinitis: Secondary | ICD-10-CM | POA: Diagnosis not present

## 2019-12-24 DIAGNOSIS — J3081 Allergic rhinitis due to animal (cat) (dog) hair and dander: Secondary | ICD-10-CM | POA: Diagnosis not present

## 2020-01-09 DIAGNOSIS — J3089 Other allergic rhinitis: Secondary | ICD-10-CM | POA: Diagnosis not present

## 2020-01-09 DIAGNOSIS — J3081 Allergic rhinitis due to animal (cat) (dog) hair and dander: Secondary | ICD-10-CM | POA: Diagnosis not present

## 2020-01-09 DIAGNOSIS — J301 Allergic rhinitis due to pollen: Secondary | ICD-10-CM | POA: Diagnosis not present

## 2020-01-23 DIAGNOSIS — J301 Allergic rhinitis due to pollen: Secondary | ICD-10-CM | POA: Diagnosis not present

## 2020-01-23 DIAGNOSIS — J3089 Other allergic rhinitis: Secondary | ICD-10-CM | POA: Diagnosis not present

## 2020-01-23 DIAGNOSIS — J3081 Allergic rhinitis due to animal (cat) (dog) hair and dander: Secondary | ICD-10-CM | POA: Diagnosis not present

## 2020-02-13 DIAGNOSIS — J301 Allergic rhinitis due to pollen: Secondary | ICD-10-CM | POA: Diagnosis not present

## 2020-02-13 DIAGNOSIS — J3081 Allergic rhinitis due to animal (cat) (dog) hair and dander: Secondary | ICD-10-CM | POA: Diagnosis not present

## 2020-02-13 DIAGNOSIS — J3089 Other allergic rhinitis: Secondary | ICD-10-CM | POA: Diagnosis not present

## 2020-03-03 DIAGNOSIS — J3081 Allergic rhinitis due to animal (cat) (dog) hair and dander: Secondary | ICD-10-CM | POA: Diagnosis not present

## 2020-03-03 DIAGNOSIS — J3089 Other allergic rhinitis: Secondary | ICD-10-CM | POA: Diagnosis not present

## 2020-03-03 DIAGNOSIS — J301 Allergic rhinitis due to pollen: Secondary | ICD-10-CM | POA: Diagnosis not present

## 2020-03-18 DIAGNOSIS — J3089 Other allergic rhinitis: Secondary | ICD-10-CM | POA: Diagnosis not present

## 2020-03-18 DIAGNOSIS — J3081 Allergic rhinitis due to animal (cat) (dog) hair and dander: Secondary | ICD-10-CM | POA: Diagnosis not present

## 2020-03-18 DIAGNOSIS — J301 Allergic rhinitis due to pollen: Secondary | ICD-10-CM | POA: Diagnosis not present

## 2020-04-02 DIAGNOSIS — J3081 Allergic rhinitis due to animal (cat) (dog) hair and dander: Secondary | ICD-10-CM | POA: Diagnosis not present

## 2020-04-02 DIAGNOSIS — J301 Allergic rhinitis due to pollen: Secondary | ICD-10-CM | POA: Diagnosis not present

## 2020-04-02 DIAGNOSIS — J3089 Other allergic rhinitis: Secondary | ICD-10-CM | POA: Diagnosis not present

## 2020-04-16 DIAGNOSIS — J301 Allergic rhinitis due to pollen: Secondary | ICD-10-CM | POA: Diagnosis not present

## 2020-04-16 DIAGNOSIS — J3081 Allergic rhinitis due to animal (cat) (dog) hair and dander: Secondary | ICD-10-CM | POA: Diagnosis not present

## 2020-04-16 DIAGNOSIS — J3089 Other allergic rhinitis: Secondary | ICD-10-CM | POA: Diagnosis not present

## 2020-04-20 DIAGNOSIS — K200 Eosinophilic esophagitis: Secondary | ICD-10-CM | POA: Diagnosis not present

## 2020-04-20 DIAGNOSIS — J301 Allergic rhinitis due to pollen: Secondary | ICD-10-CM | POA: Diagnosis not present

## 2020-04-20 DIAGNOSIS — J3089 Other allergic rhinitis: Secondary | ICD-10-CM | POA: Diagnosis not present

## 2020-04-20 DIAGNOSIS — B999 Unspecified infectious disease: Secondary | ICD-10-CM | POA: Diagnosis not present

## 2020-05-20 DIAGNOSIS — J3081 Allergic rhinitis due to animal (cat) (dog) hair and dander: Secondary | ICD-10-CM | POA: Diagnosis not present

## 2020-05-20 DIAGNOSIS — J301 Allergic rhinitis due to pollen: Secondary | ICD-10-CM | POA: Diagnosis not present

## 2020-05-20 DIAGNOSIS — J3089 Other allergic rhinitis: Secondary | ICD-10-CM | POA: Diagnosis not present

## 2020-06-02 DIAGNOSIS — J3081 Allergic rhinitis due to animal (cat) (dog) hair and dander: Secondary | ICD-10-CM | POA: Diagnosis not present

## 2020-06-02 DIAGNOSIS — J3089 Other allergic rhinitis: Secondary | ICD-10-CM | POA: Diagnosis not present

## 2020-06-02 DIAGNOSIS — J301 Allergic rhinitis due to pollen: Secondary | ICD-10-CM | POA: Diagnosis not present

## 2020-06-04 DIAGNOSIS — J3081 Allergic rhinitis due to animal (cat) (dog) hair and dander: Secondary | ICD-10-CM | POA: Diagnosis not present

## 2020-06-04 DIAGNOSIS — J301 Allergic rhinitis due to pollen: Secondary | ICD-10-CM | POA: Diagnosis not present

## 2020-06-07 DIAGNOSIS — J3089 Other allergic rhinitis: Secondary | ICD-10-CM | POA: Diagnosis not present

## 2020-06-22 DIAGNOSIS — J3081 Allergic rhinitis due to animal (cat) (dog) hair and dander: Secondary | ICD-10-CM | POA: Diagnosis not present

## 2020-06-22 DIAGNOSIS — J301 Allergic rhinitis due to pollen: Secondary | ICD-10-CM | POA: Diagnosis not present

## 2020-06-22 DIAGNOSIS — J3089 Other allergic rhinitis: Secondary | ICD-10-CM | POA: Diagnosis not present

## 2020-07-13 DIAGNOSIS — J301 Allergic rhinitis due to pollen: Secondary | ICD-10-CM | POA: Diagnosis not present

## 2020-07-13 DIAGNOSIS — J3081 Allergic rhinitis due to animal (cat) (dog) hair and dander: Secondary | ICD-10-CM | POA: Diagnosis not present

## 2020-07-13 DIAGNOSIS — J3089 Other allergic rhinitis: Secondary | ICD-10-CM | POA: Diagnosis not present

## 2020-08-03 DIAGNOSIS — J301 Allergic rhinitis due to pollen: Secondary | ICD-10-CM | POA: Diagnosis not present

## 2020-08-03 DIAGNOSIS — J3089 Other allergic rhinitis: Secondary | ICD-10-CM | POA: Diagnosis not present

## 2020-08-03 DIAGNOSIS — J3081 Allergic rhinitis due to animal (cat) (dog) hair and dander: Secondary | ICD-10-CM | POA: Diagnosis not present

## 2020-08-26 DIAGNOSIS — J3089 Other allergic rhinitis: Secondary | ICD-10-CM | POA: Diagnosis not present

## 2020-08-26 DIAGNOSIS — J3081 Allergic rhinitis due to animal (cat) (dog) hair and dander: Secondary | ICD-10-CM | POA: Diagnosis not present

## 2020-08-26 DIAGNOSIS — J301 Allergic rhinitis due to pollen: Secondary | ICD-10-CM | POA: Diagnosis not present

## 2020-09-02 DIAGNOSIS — J3089 Other allergic rhinitis: Secondary | ICD-10-CM | POA: Diagnosis not present

## 2020-09-02 DIAGNOSIS — J301 Allergic rhinitis due to pollen: Secondary | ICD-10-CM | POA: Diagnosis not present

## 2020-09-02 DIAGNOSIS — J3081 Allergic rhinitis due to animal (cat) (dog) hair and dander: Secondary | ICD-10-CM | POA: Diagnosis not present

## 2020-09-16 DIAGNOSIS — J3089 Other allergic rhinitis: Secondary | ICD-10-CM | POA: Diagnosis not present

## 2020-09-16 DIAGNOSIS — J301 Allergic rhinitis due to pollen: Secondary | ICD-10-CM | POA: Diagnosis not present

## 2020-09-16 DIAGNOSIS — J3081 Allergic rhinitis due to animal (cat) (dog) hair and dander: Secondary | ICD-10-CM | POA: Diagnosis not present

## 2020-09-23 DIAGNOSIS — J301 Allergic rhinitis due to pollen: Secondary | ICD-10-CM | POA: Diagnosis not present

## 2020-09-23 DIAGNOSIS — J3081 Allergic rhinitis due to animal (cat) (dog) hair and dander: Secondary | ICD-10-CM | POA: Diagnosis not present

## 2020-09-23 DIAGNOSIS — J3089 Other allergic rhinitis: Secondary | ICD-10-CM | POA: Diagnosis not present

## 2020-10-05 DIAGNOSIS — J301 Allergic rhinitis due to pollen: Secondary | ICD-10-CM | POA: Diagnosis not present

## 2020-10-05 DIAGNOSIS — J3081 Allergic rhinitis due to animal (cat) (dog) hair and dander: Secondary | ICD-10-CM | POA: Diagnosis not present

## 2020-10-05 DIAGNOSIS — J3089 Other allergic rhinitis: Secondary | ICD-10-CM | POA: Diagnosis not present

## 2020-10-19 DIAGNOSIS — J3081 Allergic rhinitis due to animal (cat) (dog) hair and dander: Secondary | ICD-10-CM | POA: Diagnosis not present

## 2020-10-19 DIAGNOSIS — J301 Allergic rhinitis due to pollen: Secondary | ICD-10-CM | POA: Diagnosis not present

## 2020-10-19 DIAGNOSIS — J3089 Other allergic rhinitis: Secondary | ICD-10-CM | POA: Diagnosis not present

## 2020-11-24 DIAGNOSIS — J3089 Other allergic rhinitis: Secondary | ICD-10-CM | POA: Diagnosis not present

## 2020-11-24 DIAGNOSIS — J3081 Allergic rhinitis due to animal (cat) (dog) hair and dander: Secondary | ICD-10-CM | POA: Diagnosis not present

## 2020-11-24 DIAGNOSIS — J301 Allergic rhinitis due to pollen: Secondary | ICD-10-CM | POA: Diagnosis not present

## 2020-12-14 DIAGNOSIS — J301 Allergic rhinitis due to pollen: Secondary | ICD-10-CM | POA: Diagnosis not present

## 2020-12-14 DIAGNOSIS — J3081 Allergic rhinitis due to animal (cat) (dog) hair and dander: Secondary | ICD-10-CM | POA: Diagnosis not present

## 2020-12-14 DIAGNOSIS — J3089 Other allergic rhinitis: Secondary | ICD-10-CM | POA: Diagnosis not present

## 2021-01-07 DIAGNOSIS — J3089 Other allergic rhinitis: Secondary | ICD-10-CM | POA: Diagnosis not present

## 2021-01-07 DIAGNOSIS — J3081 Allergic rhinitis due to animal (cat) (dog) hair and dander: Secondary | ICD-10-CM | POA: Diagnosis not present

## 2021-01-07 DIAGNOSIS — J301 Allergic rhinitis due to pollen: Secondary | ICD-10-CM | POA: Diagnosis not present

## 2021-01-20 DIAGNOSIS — J3081 Allergic rhinitis due to animal (cat) (dog) hair and dander: Secondary | ICD-10-CM | POA: Diagnosis not present

## 2021-01-20 DIAGNOSIS — J301 Allergic rhinitis due to pollen: Secondary | ICD-10-CM | POA: Diagnosis not present

## 2021-01-20 DIAGNOSIS — J3089 Other allergic rhinitis: Secondary | ICD-10-CM | POA: Diagnosis not present

## 2021-01-28 DIAGNOSIS — J301 Allergic rhinitis due to pollen: Secondary | ICD-10-CM | POA: Diagnosis not present

## 2021-01-28 DIAGNOSIS — J3081 Allergic rhinitis due to animal (cat) (dog) hair and dander: Secondary | ICD-10-CM | POA: Diagnosis not present

## 2021-01-28 DIAGNOSIS — J3089 Other allergic rhinitis: Secondary | ICD-10-CM | POA: Diagnosis not present

## 2021-02-15 ENCOUNTER — Other Ambulatory Visit (HOSPITAL_COMMUNITY): Payer: Self-pay

## 2021-02-15 DIAGNOSIS — L718 Other rosacea: Secondary | ICD-10-CM | POA: Diagnosis not present

## 2021-02-15 DIAGNOSIS — L910 Hypertrophic scar: Secondary | ICD-10-CM | POA: Diagnosis not present

## 2021-02-15 DIAGNOSIS — L700 Acne vulgaris: Secondary | ICD-10-CM | POA: Diagnosis not present

## 2021-02-15 DIAGNOSIS — L400 Psoriasis vulgaris: Secondary | ICD-10-CM | POA: Diagnosis not present

## 2021-02-15 DIAGNOSIS — L538 Other specified erythematous conditions: Secondary | ICD-10-CM | POA: Diagnosis not present

## 2021-02-22 DIAGNOSIS — J3081 Allergic rhinitis due to animal (cat) (dog) hair and dander: Secondary | ICD-10-CM | POA: Diagnosis not present

## 2021-02-22 DIAGNOSIS — J301 Allergic rhinitis due to pollen: Secondary | ICD-10-CM | POA: Diagnosis not present

## 2021-02-22 DIAGNOSIS — J3089 Other allergic rhinitis: Secondary | ICD-10-CM | POA: Diagnosis not present

## 2021-03-01 ENCOUNTER — Telehealth: Payer: Self-pay | Admitting: Pharmacist

## 2021-03-01 NOTE — Telephone Encounter (Signed)
Called patient to schedule an appointment for the [employer] [facility]. I was unable to reach the patient so I left a HIPAA-compliant message requesting that the patient return my call.   Luke Van Ausdall, PharmD, BCACP, CPP Clinical Pharmacist [facility] [phone]  

## 2021-03-04 ENCOUNTER — Other Ambulatory Visit: Payer: Self-pay

## 2021-03-04 ENCOUNTER — Ambulatory Visit: Payer: 59 | Attending: Internal Medicine | Admitting: Pharmacist

## 2021-03-04 DIAGNOSIS — Z79899 Other long term (current) drug therapy: Secondary | ICD-10-CM

## 2021-03-04 MED ORDER — OTEZLA 30 MG PO TABS: ORAL_TABLET | ORAL | 11 refills | Status: DC

## 2021-03-04 NOTE — Progress Notes (Signed)
  S: Patient presents for review of their specialty medication therapy.  Patient is currently taking Otezla for psoriasis. Patient is managed by Dr. Mason Jim for this.   Adherence: has not yet started   Efficacy: has not yet started   Dosing:  Active psoriatic arthritis or plaque psoriasis (moderate to severe): Oral: Initial: 10 mg in the morning. Titrate upward by additional 10 mg per day on days 2 to 5 as follows: Day 2: 10 mg twice daily; Day 3: 10 mg in the morning and 20 mg in the evening; Day 4: 20 mg twice daily; Day 5: 20 mg in the morning and 30 mg in the evening. Maintenance dose: 30 mg twice daily starting on day 6  CrCl <30 mL/minute: Initial: 10 mg in the morning on days 1 to 3; titrate using morning doses only (skip evening doses) to 20 mg on days 4 and 5. Maintenance dose: 30 mg once daily in the morning starting on day 6.   Current adverse effects: Headache: has not yet started GI upset: has not yet started  Weight loss: has not yet started  Neuropsychiatric effects: has not yet started   O:  No results found for: WBC, HGB, HCT, MCV, PLT    Chemistry   No results found for: NA, K, CL, CO2, BUN, CREATININE, GLU No results found for: CALCIUM, ALKPHOS, AST, ALT, BILITOT     A/P: 1. Medication review: patient is currently prescribed Otezla for psoriasis. Reviewed the medication including the following: apremilast inhibits phosphodiesterase 4 (PDE4) specific for cyclic adenosine monophosphate (cAMP) which results in increased intracellular cAMP levels and regulation of numerous inflammatory mediators (eg, decreased expression of nitric oxide synthase, TNF-alpha, and interleukin [IL]-23, as well as increased IL-10. Patient educated on purpose, proper use and potential adverse effects of Otezla. Possible adverse effects include weight loss, GI upset, headache, and mood changes. Renal function should be routinely monitored. Administer without regard to food. Do not crush, chew, or  split tablets. No recommendations for any changes at this time.  Butch Penny, PharmD, Patsy [place], CPP Clinical Pharmacist [facility] [phone]

## 2021-03-08 DIAGNOSIS — J3081 Allergic rhinitis due to animal (cat) (dog) hair and dander: Secondary | ICD-10-CM | POA: Diagnosis not present

## 2021-03-08 DIAGNOSIS — J3089 Other allergic rhinitis: Secondary | ICD-10-CM | POA: Diagnosis not present

## 2021-03-08 DIAGNOSIS — J301 Allergic rhinitis due to pollen: Secondary | ICD-10-CM | POA: Diagnosis not present

## 2021-03-12 ENCOUNTER — Other Ambulatory Visit (HOSPITAL_COMMUNITY): Payer: Self-pay

## 2021-03-14 ENCOUNTER — Other Ambulatory Visit (HOSPITAL_COMMUNITY): Payer: Self-pay

## 2021-03-14 MED ORDER — APREMILAST 30 MG PO TABS
ORAL_TABLET | ORAL | 11 refills | Status: DC
  Filled 2021-03-14: qty 60, 30d supply, fill #0
  Filled 2021-03-14 – 2021-04-05 (×2): qty 60, 30d supply, fill #1
  Filled 2021-05-13: qty 60, 30d supply, fill #2

## 2021-03-24 DIAGNOSIS — J3081 Allergic rhinitis due to animal (cat) (dog) hair and dander: Secondary | ICD-10-CM | POA: Diagnosis not present

## 2021-03-24 DIAGNOSIS — J301 Allergic rhinitis due to pollen: Secondary | ICD-10-CM | POA: Diagnosis not present

## 2021-03-24 DIAGNOSIS — J3089 Other allergic rhinitis: Secondary | ICD-10-CM | POA: Diagnosis not present

## 2021-04-05 ENCOUNTER — Other Ambulatory Visit (HOSPITAL_COMMUNITY): Payer: Self-pay

## 2021-04-07 DIAGNOSIS — J301 Allergic rhinitis due to pollen: Secondary | ICD-10-CM | POA: Diagnosis not present

## 2021-04-07 DIAGNOSIS — J3081 Allergic rhinitis due to animal (cat) (dog) hair and dander: Secondary | ICD-10-CM | POA: Diagnosis not present

## 2021-04-07 DIAGNOSIS — J3089 Other allergic rhinitis: Secondary | ICD-10-CM | POA: Diagnosis not present

## 2021-04-12 ENCOUNTER — Other Ambulatory Visit (HOSPITAL_COMMUNITY): Payer: Self-pay

## 2021-04-15 ENCOUNTER — Other Ambulatory Visit (HOSPITAL_COMMUNITY): Payer: Self-pay

## 2021-04-26 ENCOUNTER — Other Ambulatory Visit: Payer: Self-pay

## 2021-04-26 DIAGNOSIS — L700 Acne vulgaris: Secondary | ICD-10-CM | POA: Diagnosis not present

## 2021-04-26 DIAGNOSIS — L718 Other rosacea: Secondary | ICD-10-CM | POA: Diagnosis not present

## 2021-04-26 DIAGNOSIS — L400 Psoriasis vulgaris: Secondary | ICD-10-CM | POA: Diagnosis not present

## 2021-04-26 DIAGNOSIS — L720 Epidermal cyst: Secondary | ICD-10-CM | POA: Diagnosis not present

## 2021-04-26 DIAGNOSIS — L910 Hypertrophic scar: Secondary | ICD-10-CM | POA: Diagnosis not present

## 2021-04-26 MED ORDER — METRONIDAZOLE 0.75 % EX CREA
TOPICAL_CREAM | CUTANEOUS | 5 refills | Status: AC
  Filled 2021-04-26: qty 45, 30d supply, fill #0

## 2021-05-03 DIAGNOSIS — J3089 Other allergic rhinitis: Secondary | ICD-10-CM | POA: Diagnosis not present

## 2021-05-03 DIAGNOSIS — J301 Allergic rhinitis due to pollen: Secondary | ICD-10-CM | POA: Diagnosis not present

## 2021-05-03 DIAGNOSIS — J3081 Allergic rhinitis due to animal (cat) (dog) hair and dander: Secondary | ICD-10-CM | POA: Diagnosis not present

## 2021-05-06 ENCOUNTER — Other Ambulatory Visit: Payer: Self-pay

## 2021-05-13 ENCOUNTER — Other Ambulatory Visit (HOSPITAL_COMMUNITY): Payer: Self-pay

## 2021-05-13 DIAGNOSIS — J3081 Allergic rhinitis due to animal (cat) (dog) hair and dander: Secondary | ICD-10-CM | POA: Diagnosis not present

## 2021-05-13 DIAGNOSIS — J301 Allergic rhinitis due to pollen: Secondary | ICD-10-CM | POA: Diagnosis not present

## 2021-05-13 DIAGNOSIS — J3089 Other allergic rhinitis: Secondary | ICD-10-CM | POA: Diagnosis not present

## 2021-05-17 ENCOUNTER — Other Ambulatory Visit (HOSPITAL_COMMUNITY): Payer: Self-pay

## 2021-05-31 DIAGNOSIS — L400 Psoriasis vulgaris: Secondary | ICD-10-CM | POA: Diagnosis not present

## 2021-06-07 ENCOUNTER — Other Ambulatory Visit (HOSPITAL_COMMUNITY): Payer: Self-pay

## 2021-06-07 DIAGNOSIS — J3081 Allergic rhinitis due to animal (cat) (dog) hair and dander: Secondary | ICD-10-CM | POA: Diagnosis not present

## 2021-06-07 DIAGNOSIS — J3089 Other allergic rhinitis: Secondary | ICD-10-CM | POA: Diagnosis not present

## 2021-06-07 DIAGNOSIS — J301 Allergic rhinitis due to pollen: Secondary | ICD-10-CM | POA: Diagnosis not present

## 2021-06-16 DIAGNOSIS — J301 Allergic rhinitis due to pollen: Secondary | ICD-10-CM | POA: Diagnosis not present

## 2021-06-16 DIAGNOSIS — J3081 Allergic rhinitis due to animal (cat) (dog) hair and dander: Secondary | ICD-10-CM | POA: Diagnosis not present

## 2021-06-16 DIAGNOSIS — J3089 Other allergic rhinitis: Secondary | ICD-10-CM | POA: Diagnosis not present

## 2021-06-16 DIAGNOSIS — K200 Eosinophilic esophagitis: Secondary | ICD-10-CM | POA: Diagnosis not present

## 2021-07-12 DIAGNOSIS — J301 Allergic rhinitis due to pollen: Secondary | ICD-10-CM | POA: Diagnosis not present

## 2021-07-12 DIAGNOSIS — J3081 Allergic rhinitis due to animal (cat) (dog) hair and dander: Secondary | ICD-10-CM | POA: Diagnosis not present

## 2021-07-12 DIAGNOSIS — J3089 Other allergic rhinitis: Secondary | ICD-10-CM | POA: Diagnosis not present

## 2021-07-19 DIAGNOSIS — J3081 Allergic rhinitis due to animal (cat) (dog) hair and dander: Secondary | ICD-10-CM | POA: Diagnosis not present

## 2021-07-19 DIAGNOSIS — J301 Allergic rhinitis due to pollen: Secondary | ICD-10-CM | POA: Diagnosis not present

## 2021-07-19 DIAGNOSIS — J3089 Other allergic rhinitis: Secondary | ICD-10-CM | POA: Diagnosis not present

## 2021-08-02 DIAGNOSIS — Z79899 Other long term (current) drug therapy: Secondary | ICD-10-CM | POA: Diagnosis not present

## 2021-08-02 DIAGNOSIS — L400 Psoriasis vulgaris: Secondary | ICD-10-CM | POA: Diagnosis not present

## 2021-08-03 ENCOUNTER — Other Ambulatory Visit (HOSPITAL_COMMUNITY): Payer: Self-pay

## 2021-08-04 DIAGNOSIS — J3081 Allergic rhinitis due to animal (cat) (dog) hair and dander: Secondary | ICD-10-CM | POA: Diagnosis not present

## 2021-08-04 DIAGNOSIS — J3089 Other allergic rhinitis: Secondary | ICD-10-CM | POA: Diagnosis not present

## 2021-08-04 DIAGNOSIS — J301 Allergic rhinitis due to pollen: Secondary | ICD-10-CM | POA: Diagnosis not present

## 2021-08-08 ENCOUNTER — Other Ambulatory Visit: Payer: Self-pay

## 2021-08-08 ENCOUNTER — Ambulatory Visit: Payer: 59 | Attending: Family Medicine | Admitting: Pharmacist

## 2021-08-08 ENCOUNTER — Other Ambulatory Visit (HOSPITAL_COMMUNITY): Payer: Self-pay

## 2021-08-08 DIAGNOSIS — Z79899 Other long term (current) drug therapy: Secondary | ICD-10-CM

## 2021-08-08 MED ORDER — HUMIRA (2 PEN) 40 MG/0.4ML ~~LOC~~ AJKT: 40.0000 mg | AUTO-INJECTOR | SUBCUTANEOUS | 6 refills | Status: DC

## 2021-08-08 MED ORDER — HUMIRA-PS/UV/ADOL HS STARTER 40 MG/0.8ML ~~LOC~~ PNKT
PEN_INJECTOR | SUBCUTANEOUS | 0 refills | Status: DC
  Filled 2021-08-08: qty 1, fill #0

## 2021-08-08 MED ORDER — HUMIRA-CD/UC/HS STARTER 40 MG/0.8ML ~~LOC~~ PNKT: 80.0000 mg | PEN_INJECTOR | SUBCUTANEOUS | 0 refills | Status: DC

## 2021-08-08 MED ORDER — HUMIRA (2 PEN) 40 MG/0.4ML ~~LOC~~ AJKT
40.0000 mg | AUTO-INJECTOR | SUBCUTANEOUS | 6 refills | Status: AC
  Filled 2021-08-08: qty 2, fill #0

## 2021-08-08 NOTE — Progress Notes (Signed)
  S: Patient presents for review of their specialty medication therapy.  Patient is switching from [place] to Humira for psoriasis. Patient is managed by [facility] for this.   Adherence: has not yet started   Efficacy: has not yet started   Dosing: Psoriatic arthritis: SubQ: 40 mg every other week (may continue methotrexate, other nonbiologic DMARDS, corticosteroids, NSAIDs and/or analgesics)  Dose adjustments: Renal: no dose adjustments (has not been studied) Hepatic: no dose adjustments (has not been studied)  Drug-drug interactions: none identified   Screening: TB test: completed  Hepatitis: completed   Monitoring: S/sx of infection: none  CBC: monitored  S/sx of hypersensitivity: none S/sx of malignancy: none  S/sx of heart failure: none  Other side effects: none  O:     No results found for: WBC, HGB, HCT, MCV, PLT    Chemistry   No results found for: NA, K, CL, CO2, BUN, CREATININE, GLU No results found for: CALCIUM, ALKPHOS, AST, ALT, BILITOT     A/P: 1. Medication review: Patient about to start Humira for psoriasis. Reviewed the medication with the patient, including the following: Humira is a TNF blocking agent indicated for ankylosing spondylitis, Crohn's disease, Hidradenitis suppurativa, psoriatic arthritis, plaque psoriasis, ulcerative colitis, and uveitis. Patient educated on purpose, proper use and potential adverse effects of Humira. Possible adverse effects are increased risk of infections, headache, and injection site reactions. There is the possibility of an increased risk of malignancy but it is not well understood if this increased risk is due to there medication or the disease state. There are rare cases of pancytopenia and aplastic anemia. For SubQ injection at separate sites in the thigh or lower abdomen (avoiding areas within 2 inches of navel); rotate injection sites. May leave at room temperature for ~15 to 30 minutes prior to use; do not remove cap or  cover while allowing product to reach room temperature. Do not use if solution is discolored or contains particulate matter. Do not administer to skin which is red, tender, bruised, hard, or that has scars, stretch marks, or psoriasis plaques. Needle cap of the prefilled syringe or needle cover for the adalimumab pen may contain latex. Prefilled pens and syringes are available for use by patients and the full amount of the syringe should be injected (self-administration); the vial is intended for institutional use only. Vials do not contain a preservative; discard unused portion. No recommendations for any changes at this time.   Butch Penny, PharmD, Patsy [place], CPP Clinical Pharmacist [facility] & [facility] [phone]

## 2021-08-09 ENCOUNTER — Other Ambulatory Visit (HOSPITAL_COMMUNITY): Payer: Self-pay

## 2021-08-09 ENCOUNTER — Other Ambulatory Visit: Payer: Self-pay | Admitting: Pharmacist

## 2021-08-09 MED ORDER — HUMIRA-PSORIASIS/UVEIT STARTER 80 MG/0.8ML & 40MG/0.4ML ~~LOC~~ PNKT
PEN_INJECTOR | SUBCUTANEOUS | 0 refills | Status: AC
  Filled 2021-08-09: qty 1, fill #0

## 2021-08-10 ENCOUNTER — Other Ambulatory Visit (HOSPITAL_COMMUNITY): Payer: Self-pay

## 2021-08-12 ENCOUNTER — Other Ambulatory Visit (HOSPITAL_COMMUNITY): Payer: Self-pay

## 2021-08-16 ENCOUNTER — Other Ambulatory Visit (HOSPITAL_COMMUNITY): Payer: Self-pay

## 2021-08-18 DIAGNOSIS — J3081 Allergic rhinitis due to animal (cat) (dog) hair and dander: Secondary | ICD-10-CM | POA: Diagnosis not present

## 2021-08-18 DIAGNOSIS — J301 Allergic rhinitis due to pollen: Secondary | ICD-10-CM | POA: Diagnosis not present

## 2021-08-18 DIAGNOSIS — J3089 Other allergic rhinitis: Secondary | ICD-10-CM | POA: Diagnosis not present

## 2021-08-19 ENCOUNTER — Other Ambulatory Visit (HOSPITAL_COMMUNITY): Payer: Self-pay

## 2021-08-23 ENCOUNTER — Other Ambulatory Visit (HOSPITAL_COMMUNITY): Payer: Self-pay

## 2021-08-25 DIAGNOSIS — J019 Acute sinusitis, unspecified: Secondary | ICD-10-CM | POA: Diagnosis not present

## 2021-08-30 ENCOUNTER — Other Ambulatory Visit (HOSPITAL_COMMUNITY): Payer: Self-pay

## 2021-09-01 ENCOUNTER — Other Ambulatory Visit (HOSPITAL_COMMUNITY): Payer: Self-pay

## 2021-09-07 ENCOUNTER — Other Ambulatory Visit (HOSPITAL_COMMUNITY): Payer: Self-pay

## 2021-09-13 ENCOUNTER — Other Ambulatory Visit (HOSPITAL_COMMUNITY): Payer: Self-pay

## 2021-09-23 DIAGNOSIS — Z79899 Other long term (current) drug therapy: Secondary | ICD-10-CM | POA: Diagnosis not present

## 2021-09-27 DIAGNOSIS — J3089 Other allergic rhinitis: Secondary | ICD-10-CM | POA: Diagnosis not present

## 2021-09-27 DIAGNOSIS — J3081 Allergic rhinitis due to animal (cat) (dog) hair and dander: Secondary | ICD-10-CM | POA: Diagnosis not present

## 2021-09-27 DIAGNOSIS — J301 Allergic rhinitis due to pollen: Secondary | ICD-10-CM | POA: Diagnosis not present

## 2021-09-28 ENCOUNTER — Other Ambulatory Visit (HOSPITAL_COMMUNITY): Payer: Self-pay

## 2021-10-11 DIAGNOSIS — J301 Allergic rhinitis due to pollen: Secondary | ICD-10-CM | POA: Diagnosis not present

## 2021-10-11 DIAGNOSIS — J3089 Other allergic rhinitis: Secondary | ICD-10-CM | POA: Diagnosis not present

## 2021-10-11 DIAGNOSIS — J3081 Allergic rhinitis due to animal (cat) (dog) hair and dander: Secondary | ICD-10-CM | POA: Diagnosis not present

## 2021-10-20 ENCOUNTER — Other Ambulatory Visit (HOSPITAL_COMMUNITY): Payer: Self-pay

## 2021-10-22 ENCOUNTER — Other Ambulatory Visit (HOSPITAL_COMMUNITY): Payer: Self-pay

## 2021-10-25 ENCOUNTER — Other Ambulatory Visit (HOSPITAL_COMMUNITY): Payer: Self-pay

## 2021-10-27 ENCOUNTER — Other Ambulatory Visit (HOSPITAL_COMMUNITY): Payer: Self-pay

## 2021-10-27 DIAGNOSIS — J3089 Other allergic rhinitis: Secondary | ICD-10-CM | POA: Diagnosis not present

## 2021-10-27 DIAGNOSIS — J301 Allergic rhinitis due to pollen: Secondary | ICD-10-CM | POA: Diagnosis not present

## 2021-10-27 DIAGNOSIS — J3081 Allergic rhinitis due to animal (cat) (dog) hair and dander: Secondary | ICD-10-CM | POA: Diagnosis not present

## 2021-11-08 DIAGNOSIS — J301 Allergic rhinitis due to pollen: Secondary | ICD-10-CM | POA: Diagnosis not present

## 2021-11-08 DIAGNOSIS — J3081 Allergic rhinitis due to animal (cat) (dog) hair and dander: Secondary | ICD-10-CM | POA: Diagnosis not present

## 2021-11-08 DIAGNOSIS — J3089 Other allergic rhinitis: Secondary | ICD-10-CM | POA: Diagnosis not present

## 2021-11-11 ENCOUNTER — Other Ambulatory Visit (HOSPITAL_COMMUNITY): Payer: Self-pay

## 2021-11-22 DIAGNOSIS — J301 Allergic rhinitis due to pollen: Secondary | ICD-10-CM | POA: Diagnosis not present

## 2021-11-22 DIAGNOSIS — J3081 Allergic rhinitis due to animal (cat) (dog) hair and dander: Secondary | ICD-10-CM | POA: Diagnosis not present

## 2021-11-22 DIAGNOSIS — J3089 Other allergic rhinitis: Secondary | ICD-10-CM | POA: Diagnosis not present

## 2021-12-01 DIAGNOSIS — J301 Allergic rhinitis due to pollen: Secondary | ICD-10-CM | POA: Diagnosis not present

## 2021-12-01 DIAGNOSIS — J3089 Other allergic rhinitis: Secondary | ICD-10-CM | POA: Diagnosis not present

## 2021-12-01 DIAGNOSIS — J3081 Allergic rhinitis due to animal (cat) (dog) hair and dander: Secondary | ICD-10-CM | POA: Diagnosis not present

## 2021-12-08 ENCOUNTER — Other Ambulatory Visit (HOSPITAL_COMMUNITY): Payer: Self-pay
